# Patient Record
Sex: Male | Born: 1970 | Race: White | Hispanic: No | Marital: Single
Health system: Southern US, Community
[De-identification: ages and names within clinical notes are randomized; demographics above are authoritative.]

---

## 2013-11-09 ENCOUNTER — Emergency Department: Payer: Self-pay | Admitting: Emergency Medicine

## 2013-11-11 ENCOUNTER — Inpatient Hospital Stay: Payer: Self-pay | Admitting: Internal Medicine

## 2013-11-11 LAB — CBC WITH DIFFERENTIAL/PLATELET
Basophil #: 0.1 10*3/uL (ref 0.0–0.1)
Basophil %: 0.7 %
EOS PCT: 1.9 %
Eosinophil #: 0.2 10*3/uL (ref 0.0–0.7)
HCT: 47.8 % (ref 40.0–52.0)
HGB: 16.1 g/dL (ref 13.0–18.0)
LYMPHS ABS: 3 10*3/uL (ref 1.0–3.6)
Lymphocyte %: 34.5 %
MCH: 30.7 pg (ref 26.0–34.0)
MCHC: 33.7 g/dL (ref 32.0–36.0)
MCV: 91 fL (ref 80–100)
MONO ABS: 0.5 x10 3/mm (ref 0.2–1.0)
Monocyte %: 6.2 %
Neutrophil #: 4.9 10*3/uL (ref 1.4–6.5)
Neutrophil %: 56.7 %
PLATELETS: 267 10*3/uL (ref 150–440)
RBC: 5.25 10*6/uL (ref 4.40–5.90)
RDW: 13.6 % (ref 11.5–14.5)
WBC: 8.6 10*3/uL (ref 3.8–10.6)

## 2013-11-11 LAB — COMPREHENSIVE METABOLIC PANEL
ALK PHOS: 133 U/L — AB
Albumin: 3.9 g/dL (ref 3.4–5.0)
Anion Gap: 10 (ref 7–16)
BUN: 17 mg/dL (ref 7–18)
Bilirubin,Total: 0.4 mg/dL (ref 0.2–1.0)
CHLORIDE: 104 mmol/L (ref 98–107)
CREATININE: 0.81 mg/dL (ref 0.60–1.30)
Calcium, Total: 8.9 mg/dL (ref 8.5–10.1)
Co2: 24 mmol/L (ref 21–32)
EGFR (African American): >60
EGFR (Non-African Amer.): >60
GLUCOSE: 106 mg/dL — AB (ref 65–99)
Osmolality: 278 (ref 275–301)
Potassium: 3.9 mmol/L (ref 3.5–5.1)
SGOT(AST): 30 U/L (ref 15–37)
SGPT (ALT): 41 U/L (ref 12–78)
SODIUM: 138 mmol/L (ref 136–145)
TOTAL PROTEIN: 7.3 g/dL (ref 6.4–8.2)

## 2013-11-11 LAB — URINALYSIS, COMPLETE
BLOOD: NEGATIVE
Bacteria: NONE SEEN
Bilirubin,UR: NEGATIVE
GLUCOSE, UR: NEGATIVE mg/dL (ref 0–75)
Ketone: NEGATIVE
Leukocyte Esterase: NEGATIVE
Nitrite: NEGATIVE
PROTEIN: NEGATIVE
Ph: 5 (ref 4.5–8.0)
RBC,UR: NONE SEEN /HPF (ref 0–5)
SPECIFIC GRAVITY: 1.027 (ref 1.003–1.030)
SQUAMOUS EPITHELIAL: NONE SEEN
WBC UR: <1 /HPF (ref 0–5)

## 2013-11-11 LAB — ETHANOL

## 2013-11-11 LAB — LIPASE, BLOOD: LIPASE: 130 U/L (ref 73–393)

## 2013-11-11 LAB — CARBAMAZEPINE LEVEL, TOTAL: Carbamazepine: <0.5 ug/mL — ABNORMAL LOW (ref 4.0–12.0)

## 2013-11-11 LAB — DRUG SCREEN, URINE
Amphetamines, Ur Screen: NEGATIVE (ref ?–1000)
BARBITURATES, UR SCREEN: POSITIVE (ref ?–200)
Benzodiazepine, Ur Scrn: POSITIVE (ref ?–200)
COCAINE METABOLITE, UR ~~LOC~~: NEGATIVE (ref ?–300)
Cannabinoid 50 Ng, Ur ~~LOC~~: POSITIVE (ref ?–50)
MDMA (Ecstasy)Ur Screen: NEGATIVE (ref ?–500)
Methadone, Ur Screen: NEGATIVE (ref ?–300)
Opiate, Ur Screen: NEGATIVE (ref ?–300)
Phencyclidine (PCP) Ur S: NEGATIVE (ref ?–25)
TRICYCLIC, UR SCREEN: NEGATIVE (ref ?–1000)

## 2013-11-11 LAB — PHENOBARBITAL LEVEL: Phenobarbital: <2.1 ug/mL — ABNORMAL LOW (ref 15.0–40.0)

## 2013-11-11 LAB — PROTIME-INR
INR: 1
PROTHROMBIN TIME: 12.9 s (ref 11.5–14.7)

## 2013-11-11 LAB — PHENYTOIN LEVEL, TOTAL: Dilantin: 1 ug/mL — ABNORMAL LOW (ref 10.0–20.0)

## 2013-11-11 LAB — TROPONIN I

## 2013-11-11 LAB — MAGNESIUM: MAGNESIUM: 1.8 mg/dL

## 2013-11-11 LAB — APTT: ACTIVATED PTT: 27.9 s (ref 23.6–35.9)

## 2013-11-12 ENCOUNTER — Ambulatory Visit: Payer: Self-pay | Admitting: Neurology

## 2013-11-12 LAB — RAPID HIV-1/2 QL/CONFIRM: HIV-1/2, RAPID QL: NEGATIVE

## 2013-11-12 LAB — PHENYTOIN LEVEL, TOTAL: Dilantin: 21.7 ug/mL — ABNORMAL HIGH (ref 10.0–20.0)

## 2013-11-13 LAB — CBC WITH DIFFERENTIAL/PLATELET
BASOS PCT: 0.5 %
Basophil #: 0.1 10*3/uL (ref 0.0–0.1)
Eosinophil #: 0.1 10*3/uL (ref 0.0–0.7)
Eosinophil %: 0.5 %
HCT: 41.3 % (ref 40.0–52.0)
HGB: 13.9 g/dL (ref 13.0–18.0)
LYMPHS ABS: 1.4 10*3/uL (ref 1.0–3.6)
Lymphocyte %: 9.5 %
MCH: 30.9 pg (ref 26.0–34.0)
MCHC: 33.7 g/dL (ref 32.0–36.0)
MCV: 92 fL (ref 80–100)
Monocyte #: 0.4 x10 3/mm (ref 0.2–1.0)
Monocyte %: 2.9 %
NEUTROS ABS: 12.8 10*3/uL — AB (ref 1.4–6.5)
Neutrophil %: 86.6 %
PLATELETS: 191 10*3/uL (ref 150–440)
RBC: 4.51 10*6/uL (ref 4.40–5.90)
RDW: 13.4 % (ref 11.5–14.5)
WBC: 14.8 10*3/uL — AB (ref 3.8–10.6)

## 2013-11-13 LAB — MAGNESIUM
MAGNESIUM: 1.5 mg/dL — AB
MAGNESIUM: 1.8 mg/dL

## 2013-11-13 LAB — BASIC METABOLIC PANEL
ANION GAP: 3 — AB (ref 7–16)
BUN: 6 mg/dL — AB (ref 7–18)
CALCIUM: 8 mg/dL — AB (ref 8.5–10.1)
Chloride: 111 mmol/L — ABNORMAL HIGH (ref 98–107)
Co2: 30 mmol/L (ref 21–32)
Creatinine: 1.05 mg/dL (ref 0.60–1.30)
EGFR (African American): >60
EGFR (Non-African Amer.): >60
Glucose: 97 mg/dL (ref 65–99)
OSMOLALITY: 284 (ref 275–301)
POTASSIUM: 3.3 mmol/L — AB (ref 3.5–5.1)
SODIUM: 144 mmol/L (ref 136–145)

## 2013-11-13 LAB — PHOSPHORUS
Phosphorus: 1.1 mg/dL — ABNORMAL LOW (ref 2.5–4.9)
Phosphorus: 4 mg/dL (ref 2.5–4.9)

## 2013-11-13 LAB — PHENYTOIN LEVEL, TOTAL: Dilantin: 13.7 ug/mL (ref 10.0–20.0)

## 2013-11-14 LAB — CBC WITH DIFFERENTIAL/PLATELET
Basophil #: 0 10*3/uL (ref 0.0–0.1)
Basophil %: 0.1 %
Eosinophil #: 0.1 10*3/uL (ref 0.0–0.7)
Eosinophil %: 0.6 %
HCT: 36.6 % — AB (ref 40.0–52.0)
HGB: 12.1 g/dL — AB (ref 13.0–18.0)
LYMPHS PCT: 7.4 %
Lymphocyte #: 1.2 10*3/uL (ref 1.0–3.6)
MCH: 30.2 pg (ref 26.0–34.0)
MCHC: 32.9 g/dL (ref 32.0–36.0)
MCV: 92 fL (ref 80–100)
MONOS PCT: 3.8 %
Monocyte #: 0.6 x10 3/mm (ref 0.2–1.0)
NEUTROS ABS: 14.3 10*3/uL — AB (ref 1.4–6.5)
Neutrophil %: 88.1 %
Platelet: 170 10*3/uL (ref 150–440)
RBC: 3.99 10*6/uL — ABNORMAL LOW (ref 4.40–5.90)
RDW: 13.4 % (ref 11.5–14.5)
WBC: 16.3 10*3/uL — ABNORMAL HIGH (ref 3.8–10.6)

## 2013-11-14 LAB — BASIC METABOLIC PANEL
ANION GAP: 6 — AB (ref 7–16)
BUN: 12 mg/dL (ref 7–18)
CALCIUM: 7.9 mg/dL — AB (ref 8.5–10.1)
CO2: 29 mmol/L (ref 21–32)
Chloride: 106 mmol/L (ref 98–107)
Creatinine: 1.11 mg/dL (ref 0.60–1.30)
EGFR (Non-African Amer.): >60
GLUCOSE: 121 mg/dL — AB (ref 65–99)
OSMOLALITY: 282 (ref 275–301)
Potassium: 3.7 mmol/L (ref 3.5–5.1)
Sodium: 141 mmol/L (ref 136–145)

## 2013-11-15 LAB — COMPREHENSIVE METABOLIC PANEL
ALK PHOS: 96 U/L
ALT: 19 U/L (ref 12–78)
AST: 25 U/L (ref 15–37)
Albumin: 2.2 g/dL — ABNORMAL LOW (ref 3.4–5.0)
Anion Gap: 2 — ABNORMAL LOW (ref 7–16)
BILIRUBIN TOTAL: 0.6 mg/dL (ref 0.2–1.0)
BUN: 12 mg/dL (ref 7–18)
CREATININE: 1.02 mg/dL (ref 0.60–1.30)
Calcium, Total: 8.3 mg/dL — ABNORMAL LOW (ref 8.5–10.1)
Chloride: 108 mmol/L — ABNORMAL HIGH (ref 98–107)
Co2: 31 mmol/L (ref 21–32)
EGFR (Non-African Amer.): >60
Glucose: 114 mg/dL — ABNORMAL HIGH (ref 65–99)
Osmolality: 282 (ref 275–301)
Potassium: 3.6 mmol/L (ref 3.5–5.1)
SODIUM: 141 mmol/L (ref 136–145)
Total Protein: 5.5 g/dL — ABNORMAL LOW (ref 6.4–8.2)

## 2013-11-15 LAB — CBC WITH DIFFERENTIAL/PLATELET
BASOS PCT: 0.3 %
Basophil #: 0 10*3/uL (ref 0.0–0.1)
Eosinophil #: 0.2 10*3/uL (ref 0.0–0.7)
Eosinophil %: 1.6 %
HCT: 34.4 % — ABNORMAL LOW (ref 40.0–52.0)
HGB: 11.3 g/dL — AB (ref 13.0–18.0)
Lymphocyte #: 1.7 10*3/uL (ref 1.0–3.6)
Lymphocyte %: 12.5 %
MCH: 30.4 pg (ref 26.0–34.0)
MCHC: 32.9 g/dL (ref 32.0–36.0)
MCV: 93 fL (ref 80–100)
MONO ABS: 0.4 x10 3/mm (ref 0.2–1.0)
Monocyte %: 3.1 %
Neutrophil #: 11.2 10*3/uL — ABNORMAL HIGH (ref 1.4–6.5)
Neutrophil %: 82.5 %
Platelet: 167 10*3/uL (ref 150–440)
RBC: 3.72 10*6/uL — ABNORMAL LOW (ref 4.40–5.90)
RDW: 13.6 % (ref 11.5–14.5)
WBC: 13.6 10*3/uL — AB (ref 3.8–10.6)

## 2013-11-15 LAB — PHENYTOIN LEVEL, TOTAL: DILANTIN: 15.3 ug/mL (ref 10.0–20.0)

## 2013-11-15 LAB — VANCOMYCIN, TROUGH: Vancomycin, Trough: 19 ug/mL (ref 10–20)

## 2013-11-16 LAB — CBC WITH DIFFERENTIAL/PLATELET
BASOS ABS: 0.1 10*3/uL (ref 0.0–0.1)
BASOS PCT: 0.6 %
EOS PCT: 2.4 %
Eosinophil #: 0.2 10*3/uL (ref 0.0–0.7)
HCT: 35.5 % — AB (ref 40.0–52.0)
HGB: 11.6 g/dL — ABNORMAL LOW (ref 13.0–18.0)
LYMPHS ABS: 1.6 10*3/uL (ref 1.0–3.6)
Lymphocyte %: 20 %
MCH: 30.3 pg (ref 26.0–34.0)
MCHC: 32.8 g/dL (ref 32.0–36.0)
MCV: 93 fL (ref 80–100)
Monocyte #: 0.3 x10 3/mm (ref 0.2–1.0)
Monocyte %: 3.9 %
Neutrophil #: 5.7 10*3/uL (ref 1.4–6.5)
Neutrophil %: 73.1 %
Platelet: 196 10*3/uL (ref 150–440)
RBC: 3.84 10*6/uL — AB (ref 4.40–5.90)
RDW: 13.6 % (ref 11.5–14.5)
WBC: 7.8 10*3/uL (ref 3.8–10.6)

## 2013-11-16 LAB — BASIC METABOLIC PANEL
Anion Gap: 3 — ABNORMAL LOW (ref 7–16)
BUN: 13 mg/dL (ref 7–18)
CALCIUM: 8.6 mg/dL (ref 8.5–10.1)
CHLORIDE: 110 mmol/L — AB (ref 98–107)
CO2: 30 mmol/L (ref 21–32)
Creatinine: 0.74 mg/dL (ref 0.60–1.30)
EGFR (African American): >60
Glucose: 119 mg/dL — ABNORMAL HIGH (ref 65–99)
Osmolality: 286 (ref 275–301)
POTASSIUM: 3.3 mmol/L — AB (ref 3.5–5.1)
SODIUM: 143 mmol/L (ref 136–145)

## 2013-11-16 LAB — EXPECTORATED SPUTUM ASSESSMENT W GRAM STAIN, RFLX TO RESP C

## 2013-11-16 LAB — VANCOMYCIN, TROUGH: VANCOMYCIN, TROUGH: 17 ug/mL (ref 10–20)

## 2013-11-16 LAB — MAGNESIUM: MAGNESIUM: 2.1 mg/dL

## 2013-11-17 LAB — BASIC METABOLIC PANEL
ANION GAP: 4 — AB (ref 7–16)
BUN: 13 mg/dL (ref 7–18)
CALCIUM: 8.5 mg/dL (ref 8.5–10.1)
CO2: 27 mmol/L (ref 21–32)
Chloride: 112 mmol/L — ABNORMAL HIGH (ref 98–107)
Creatinine: 0.69 mg/dL (ref 0.60–1.30)
EGFR (African American): >60
GLUCOSE: 94 mg/dL (ref 65–99)
Osmolality: 285 (ref 275–301)
Potassium: 3.5 mmol/L (ref 3.5–5.1)
Sodium: 143 mmol/L (ref 136–145)

## 2013-11-18 LAB — CBC WITH DIFFERENTIAL/PLATELET
Basophil: 1 %
Eosinophil: 1 %
HCT: 38.7 % — AB (ref 40.0–52.0)
HGB: 13.4 g/dL (ref 13.0–18.0)
Lymphocytes: 35 %
MCH: 31.3 pg (ref 26.0–34.0)
MCHC: 34.6 g/dL (ref 32.0–36.0)
MCV: 90 fL (ref 80–100)
METAMYELOCYTE: 2 %
Monocytes: 6 %
Myelocyte: 1 %
Platelet: 233 10*3/uL (ref 150–440)
RBC: 4.28 10*6/uL — ABNORMAL LOW (ref 4.40–5.90)
RDW: 13.2 % (ref 11.5–14.5)
Segmented Neutrophils: 54 %
WBC: 7.1 10*3/uL (ref 3.8–10.6)

## 2013-11-18 LAB — BASIC METABOLIC PANEL
Anion Gap: 4 — ABNORMAL LOW (ref 7–16)
BUN: 14 mg/dL (ref 7–18)
CALCIUM: 8.6 mg/dL (ref 8.5–10.1)
CREATININE: 0.73 mg/dL (ref 0.60–1.30)
Chloride: 111 mmol/L — ABNORMAL HIGH (ref 98–107)
Co2: 26 mmol/L (ref 21–32)
GLUCOSE: 105 mg/dL — AB (ref 65–99)
OSMOLALITY: 282 (ref 275–301)
Potassium: 3.4 mmol/L — ABNORMAL LOW (ref 3.5–5.1)
SODIUM: 141 mmol/L (ref 136–145)

## 2013-11-18 LAB — PHOSPHORUS: Phosphorus: 4.3 mg/dL (ref 2.5–4.9)

## 2013-11-18 LAB — MAGNESIUM: Magnesium: 1.6 mg/dL — ABNORMAL LOW

## 2013-11-18 LAB — CULTURE, BLOOD (SINGLE)

## 2013-11-18 LAB — EXPECTORATED SPUTUM ASSESSMENT W REFEX TO RESP CULTURE

## 2013-11-20 LAB — CBC WITH DIFFERENTIAL/PLATELET
BASOS ABS: 0 10*3/uL (ref 0.0–0.1)
BASOS PCT: 0.5 %
EOS ABS: 0.3 10*3/uL (ref 0.0–0.7)
Eosinophil %: 3 %
HCT: 37.5 % — ABNORMAL LOW (ref 40.0–52.0)
HGB: 12.8 g/dL — AB (ref 13.0–18.0)
LYMPHS PCT: 26.2 %
Lymphocyte #: 2.2 10*3/uL (ref 1.0–3.6)
MCH: 30.5 pg (ref 26.0–34.0)
MCHC: 34.2 g/dL (ref 32.0–36.0)
MCV: 89 fL (ref 80–100)
MONOS PCT: 9.6 %
Monocyte #: 0.8 x10 3/mm (ref 0.2–1.0)
Neutrophil #: 5.2 10*3/uL (ref 1.4–6.5)
Neutrophil %: 60.7 %
Platelet: 247 10*3/uL (ref 150–440)
RBC: 4.2 10*6/uL — ABNORMAL LOW (ref 4.40–5.90)
RDW: 13.2 % (ref 11.5–14.5)
WBC: 8.5 10*3/uL (ref 3.8–10.6)

## 2013-11-20 LAB — BASIC METABOLIC PANEL
Anion Gap: 11 (ref 7–16)
BUN: 14 mg/dL (ref 7–18)
Calcium, Total: 8.9 mg/dL (ref 8.5–10.1)
Chloride: 106 mmol/L (ref 98–107)
Co2: 25 mmol/L (ref 21–32)
Creatinine: 0.63 mg/dL (ref 0.60–1.30)
EGFR (African American): >60
EGFR (Non-African Amer.): >60
GLUCOSE: 101 mg/dL — AB (ref 65–99)
Osmolality: 284 (ref 275–301)
Potassium: 3.4 mmol/L — ABNORMAL LOW (ref 3.5–5.1)
Sodium: 142 mmol/L (ref 136–145)

## 2013-12-04 ENCOUNTER — Encounter: Payer: Self-pay | Admitting: Internal Medicine

## 2013-12-11 ENCOUNTER — Emergency Department: Payer: Self-pay | Admitting: Emergency Medicine

## 2013-12-11 LAB — COMPREHENSIVE METABOLIC PANEL
ALT: 49 U/L (ref 12–78)
ANION GAP: 7 (ref 7–16)
AST: 38 U/L — AB (ref 15–37)
Albumin: 4 g/dL (ref 3.4–5.0)
Alkaline Phosphatase: 159 U/L — ABNORMAL HIGH
BILIRUBIN TOTAL: 0.3 mg/dL (ref 0.2–1.0)
BUN: 14 mg/dL (ref 7–18)
Calcium, Total: 9.2 mg/dL (ref 8.5–10.1)
Chloride: 105 mmol/L (ref 98–107)
Co2: 26 mmol/L (ref 21–32)
Creatinine: 0.81 mg/dL (ref 0.60–1.30)
Glucose: 93 mg/dL (ref 65–99)
OSMOLALITY: 276 (ref 275–301)
POTASSIUM: 4.1 mmol/L (ref 3.5–5.1)
Sodium: 138 mmol/L (ref 136–145)
TOTAL PROTEIN: 7.8 g/dL (ref 6.4–8.2)

## 2013-12-11 LAB — CBC
HCT: 47.1 % (ref 40.0–52.0)
HGB: 15.9 g/dL (ref 13.0–18.0)
MCH: 30.5 pg (ref 26.0–34.0)
MCHC: 33.9 g/dL (ref 32.0–36.0)
MCV: 90 fL (ref 80–100)
PLATELETS: 361 10*3/uL (ref 150–440)
RBC: 5.22 10*6/uL (ref 4.40–5.90)
RDW: 14 % (ref 11.5–14.5)
WBC: 5.5 10*3/uL (ref 3.8–10.6)

## 2013-12-11 LAB — PHENYTOIN LEVEL, TOTAL: DILANTIN: 0.7 ug/mL — AB (ref 10.0–20.0)

## 2013-12-19 ENCOUNTER — Encounter: Payer: Self-pay | Admitting: Internal Medicine

## 2014-01-26 ENCOUNTER — Inpatient Hospital Stay: Payer: Self-pay | Admitting: Internal Medicine

## 2014-01-26 LAB — DRUG SCREEN, URINE
AMPHETAMINES, UR SCREEN: NEGATIVE (ref ?–1000)
BARBITURATES, UR SCREEN: NEGATIVE (ref ?–200)
Benzodiazepine, Ur Scrn: POSITIVE (ref ?–200)
COCAINE METABOLITE, UR ~~LOC~~: NEGATIVE (ref ?–300)
Cannabinoid 50 Ng, Ur ~~LOC~~: POSITIVE (ref ?–50)
MDMA (Ecstasy)Ur Screen: NEGATIVE (ref ?–500)
Methadone, Ur Screen: NEGATIVE (ref ?–300)
OPIATE, UR SCREEN: NEGATIVE (ref ?–300)
Phencyclidine (PCP) Ur S: NEGATIVE (ref ?–25)
Tricyclic, Ur Screen: NEGATIVE (ref ?–1000)

## 2014-01-26 LAB — URINALYSIS, COMPLETE
BACTERIA: NONE SEEN
Bilirubin,UR: NEGATIVE
Blood: NEGATIVE
GLUCOSE, UR: NEGATIVE mg/dL (ref 0–75)
Ketone: NEGATIVE
Leukocyte Esterase: NEGATIVE
NITRITE: NEGATIVE
Ph: 6 (ref 4.5–8.0)
Protein: NEGATIVE
RBC,UR: NONE SEEN /HPF (ref 0–5)
SPECIFIC GRAVITY: 1.003 (ref 1.003–1.030)
Squamous Epithelial: NONE SEEN

## 2014-01-26 LAB — CBC
HCT: 48.1 % (ref 40.0–52.0)
HGB: 16.6 g/dL (ref 13.0–18.0)
MCH: 31.2 pg (ref 26.0–34.0)
MCHC: 34.5 g/dL (ref 32.0–36.0)
MCV: 90 fL (ref 80–100)
PLATELETS: 345 10*3/uL (ref 150–440)
RBC: 5.32 10*6/uL (ref 4.40–5.90)
RDW: 14.5 % (ref 11.5–14.5)
WBC: 12.1 10*3/uL — ABNORMAL HIGH (ref 3.8–10.6)

## 2014-01-26 LAB — ETHANOL
Ethanol %: 0.121 % — ABNORMAL HIGH (ref 0.000–0.080)
Ethanol: 121 mg/dL

## 2014-01-26 LAB — COMPREHENSIVE METABOLIC PANEL
ALK PHOS: 152 U/L — AB
ANION GAP: 12 (ref 7–16)
Albumin: 4.6 g/dL (ref 3.4–5.0)
BILIRUBIN TOTAL: 0.3 mg/dL (ref 0.2–1.0)
BUN: 12 mg/dL (ref 7–18)
CREATININE: 1.01 mg/dL (ref 0.60–1.30)
Calcium, Total: 9.7 mg/dL (ref 8.5–10.1)
Chloride: 105 mmol/L (ref 98–107)
Co2: 23 mmol/L (ref 21–32)
EGFR (Non-African Amer.): >60
Glucose: 108 mg/dL — ABNORMAL HIGH (ref 65–99)
Osmolality: 280 (ref 275–301)
Potassium: 3.6 mmol/L (ref 3.5–5.1)
SGOT(AST): 33 U/L (ref 15–37)
SGPT (ALT): 51 U/L
SODIUM: 140 mmol/L (ref 136–145)
TOTAL PROTEIN: 8.3 g/dL — AB (ref 6.4–8.2)

## 2014-01-26 LAB — ACETAMINOPHEN LEVEL

## 2014-01-26 LAB — SALICYLATE LEVEL: Salicylates, Serum: 3.4 mg/dL — ABNORMAL HIGH

## 2014-01-26 LAB — TSH: Thyroid Stimulating Horm: 4.36 u[IU]/mL

## 2014-01-27 LAB — BASIC METABOLIC PANEL
ANION GAP: 9 (ref 7–16)
BUN: 12 mg/dL (ref 7–18)
CALCIUM: 8.8 mg/dL (ref 8.5–10.1)
CREATININE: 0.84 mg/dL (ref 0.60–1.30)
Chloride: 106 mmol/L (ref 98–107)
Co2: 27 mmol/L (ref 21–32)
EGFR (Non-African Amer.): >60
Glucose: 106 mg/dL — ABNORMAL HIGH (ref 65–99)
OSMOLALITY: 283 (ref 275–301)
Potassium: 3.7 mmol/L (ref 3.5–5.1)
Sodium: 142 mmol/L (ref 136–145)

## 2014-01-27 LAB — CBC WITH DIFFERENTIAL/PLATELET
BASOS PCT: 0.6 %
Basophil #: 0.1 10*3/uL (ref 0.0–0.1)
EOS ABS: 0.1 10*3/uL (ref 0.0–0.7)
Eosinophil %: 1.2 %
HCT: 44.6 % (ref 40.0–52.0)
HGB: 15.3 g/dL (ref 13.0–18.0)
LYMPHS PCT: 31.1 %
Lymphocyte #: 2.6 10*3/uL (ref 1.0–3.6)
MCH: 30.9 pg (ref 26.0–34.0)
MCHC: 34.2 g/dL (ref 32.0–36.0)
MCV: 91 fL (ref 80–100)
MONOS PCT: 8 %
Monocyte #: 0.7 x10 3/mm (ref 0.2–1.0)
Neutrophil #: 5 10*3/uL (ref 1.4–6.5)
Neutrophil %: 59.1 %
Platelet: 286 10*3/uL (ref 150–440)
RBC: 4.93 10*6/uL (ref 4.40–5.90)
RDW: 14.4 % (ref 11.5–14.5)
WBC: 8.4 10*3/uL (ref 3.8–10.6)

## 2014-01-27 LAB — MAGNESIUM: Magnesium: 1.9 mg/dL

## 2014-01-28 LAB — MAGNESIUM: Magnesium: 1.8 mg/dL

## 2014-01-28 LAB — COMPREHENSIVE METABOLIC PANEL
ALBUMIN: 3.2 g/dL — AB (ref 3.4–5.0)
ALK PHOS: 128 U/L — AB
ANION GAP: 9 (ref 7–16)
BILIRUBIN TOTAL: 0.4 mg/dL (ref 0.2–1.0)
BUN: 9 mg/dL (ref 7–18)
CREATININE: 0.87 mg/dL (ref 0.60–1.30)
Calcium, Total: 7.9 mg/dL — ABNORMAL LOW (ref 8.5–10.1)
Chloride: 113 mmol/L — ABNORMAL HIGH (ref 98–107)
Co2: 24 mmol/L (ref 21–32)
EGFR (Non-African Amer.): >60
GLUCOSE: 86 mg/dL (ref 65–99)
OSMOLALITY: 289 (ref 275–301)
Potassium: 4.5 mmol/L (ref 3.5–5.1)
SGOT(AST): 49 U/L — ABNORMAL HIGH (ref 15–37)
SGPT (ALT): 48 U/L
Sodium: 146 mmol/L — ABNORMAL HIGH (ref 136–145)
TOTAL PROTEIN: 5.9 g/dL — AB (ref 6.4–8.2)

## 2014-01-28 LAB — PHOSPHORUS: Phosphorus: 2.7 mg/dL (ref 2.5–4.9)

## 2014-01-29 LAB — BASIC METABOLIC PANEL
Anion Gap: 4 — ABNORMAL LOW (ref 7–16)
BUN: 6 mg/dL — ABNORMAL LOW (ref 7–18)
CALCIUM: 8.1 mg/dL — AB (ref 8.5–10.1)
CO2: 27 mmol/L (ref 21–32)
Chloride: 113 mmol/L — ABNORMAL HIGH (ref 98–107)
Creatinine: 0.82 mg/dL (ref 0.60–1.30)
EGFR (African American): >60
Glucose: 93 mg/dL (ref 65–99)
Osmolality: 284 (ref 275–301)
Potassium: 3.6 mmol/L (ref 3.5–5.1)
Sodium: 144 mmol/L (ref 136–145)

## 2014-01-29 LAB — TRIGLYCERIDES: TRIGLYCERIDES: 170 mg/dL (ref 0–200)

## 2014-01-29 LAB — MAGNESIUM: Magnesium: 1.2 mg/dL — ABNORMAL LOW

## 2014-01-29 LAB — PHOSPHORUS: Phosphorus: 3.3 mg/dL (ref 2.5–4.9)

## 2014-01-30 LAB — MAGNESIUM
MAGNESIUM: 1.8 mg/dL
Magnesium: 1.8 mg/dL

## 2014-01-30 LAB — BASIC METABOLIC PANEL
Anion Gap: 5 — ABNORMAL LOW (ref 7–16)
BUN: 8 mg/dL (ref 7–18)
CO2: 30 mmol/L (ref 21–32)
Calcium, Total: 8 mg/dL — ABNORMAL LOW (ref 8.5–10.1)
Chloride: 113 mmol/L — ABNORMAL HIGH (ref 98–107)
Creatinine: 0.86 mg/dL (ref 0.60–1.30)
EGFR (Non-African Amer.): >60
Glucose: 105 mg/dL — ABNORMAL HIGH (ref 65–99)
Osmolality: 293 (ref 275–301)
POTASSIUM: 3.7 mmol/L (ref 3.5–5.1)
SODIUM: 148 mmol/L — AB (ref 136–145)

## 2014-01-30 LAB — PHOSPHORUS: Phosphorus: 3.7 mg/dL (ref 2.5–4.9)

## 2014-01-31 LAB — BASIC METABOLIC PANEL
ANION GAP: 8 (ref 7–16)
BUN: 11 mg/dL (ref 7–18)
CALCIUM: 8.2 mg/dL — AB (ref 8.5–10.1)
Chloride: 109 mmol/L — ABNORMAL HIGH (ref 98–107)
Co2: 26 mmol/L (ref 21–32)
Creatinine: 0.58 mg/dL — ABNORMAL LOW (ref 0.60–1.30)
Glucose: 107 mg/dL — ABNORMAL HIGH (ref 65–99)
OSMOLALITY: 285 (ref 275–301)
Potassium: 3.4 mmol/L — ABNORMAL LOW (ref 3.5–5.1)
Sodium: 143 mmol/L (ref 136–145)

## 2014-01-31 LAB — PHOSPHORUS: Phosphorus: 4 mg/dL (ref 2.5–4.9)

## 2014-01-31 LAB — MAGNESIUM: Magnesium: 1.7 mg/dL — ABNORMAL LOW

## 2014-02-01 LAB — BASIC METABOLIC PANEL
ANION GAP: 8 (ref 7–16)
BUN: 16 mg/dL (ref 7–18)
CHLORIDE: 106 mmol/L (ref 98–107)
CO2: 29 mmol/L (ref 21–32)
CREATININE: 0.73 mg/dL (ref 0.60–1.30)
Calcium, Total: 8.4 mg/dL — ABNORMAL LOW (ref 8.5–10.1)
EGFR (African American): >60
EGFR (Non-African Amer.): >60
Glucose: 94 mg/dL (ref 65–99)
OSMOLALITY: 286 (ref 275–301)
POTASSIUM: 4 mmol/L (ref 3.5–5.1)
SODIUM: 143 mmol/L (ref 136–145)

## 2014-02-01 LAB — MAGNESIUM: Magnesium: 2 mg/dL

## 2014-02-02 LAB — BASIC METABOLIC PANEL
ANION GAP: 7 (ref 7–16)
BUN: 19 mg/dL — AB (ref 7–18)
CALCIUM: 8.9 mg/dL (ref 8.5–10.1)
CHLORIDE: 103 mmol/L (ref 98–107)
Co2: 32 mmol/L (ref 21–32)
Creatinine: 0.8 mg/dL (ref 0.60–1.30)
Glucose: 90 mg/dL (ref 65–99)
OSMOLALITY: 285 (ref 275–301)
Potassium: 4.1 mmol/L (ref 3.5–5.1)
SODIUM: 142 mmol/L (ref 136–145)

## 2014-02-02 LAB — MAGNESIUM: MAGNESIUM: 2 mg/dL

## 2014-02-02 LAB — PHOSPHORUS: Phosphorus: 3.8 mg/dL (ref 2.5–4.9)

## 2014-02-03 LAB — BASIC METABOLIC PANEL
ANION GAP: 8 (ref 7–16)
BUN: 22 mg/dL — ABNORMAL HIGH (ref 7–18)
CHLORIDE: 102 mmol/L (ref 98–107)
CREATININE: 0.64 mg/dL (ref 0.60–1.30)
Calcium, Total: 8.6 mg/dL (ref 8.5–10.1)
Co2: 31 mmol/L (ref 21–32)
EGFR (African American): >60
Glucose: 112 mg/dL — ABNORMAL HIGH (ref 65–99)
Osmolality: 285 (ref 275–301)
POTASSIUM: 4.1 mmol/L (ref 3.5–5.1)
Sodium: 141 mmol/L (ref 136–145)

## 2014-02-03 LAB — MAGNESIUM: Magnesium: 1.6 mg/dL — ABNORMAL LOW

## 2014-02-03 LAB — PHOSPHORUS: PHOSPHORUS: 2.9 mg/dL (ref 2.5–4.9)

## 2014-02-04 LAB — CBC WITH DIFFERENTIAL/PLATELET
Basophil #: 0.1 10*3/uL (ref 0.0–0.1)
Basophil %: 1.1 %
Eosinophil #: 0.1 10*3/uL (ref 0.0–0.7)
Eosinophil %: 1 %
HCT: 39.9 % — ABNORMAL LOW (ref 40.0–52.0)
HGB: 13.7 g/dL (ref 13.0–18.0)
Lymphocyte #: 1.5 10*3/uL (ref 1.0–3.6)
Lymphocyte %: 18.7 %
MCH: 30.3 pg (ref 26.0–34.0)
MCHC: 34.3 g/dL (ref 32.0–36.0)
MCV: 88 fL (ref 80–100)
Monocyte #: 0.7 x10 3/mm (ref 0.2–1.0)
Monocyte %: 8.1 %
Neutrophil #: 5.7 10*3/uL (ref 1.4–6.5)
Neutrophil %: 71.1 %
PLATELETS: 310 10*3/uL (ref 150–440)
RBC: 4.52 10*6/uL (ref 4.40–5.90)
RDW: 13.4 % (ref 11.5–14.5)
WBC: 8 10*3/uL (ref 3.8–10.6)

## 2014-02-04 LAB — BASIC METABOLIC PANEL
Anion Gap: 9 (ref 7–16)
BUN: 19 mg/dL — ABNORMAL HIGH (ref 7–18)
CALCIUM: 8.4 mg/dL — AB (ref 8.5–10.1)
CHLORIDE: 105 mmol/L (ref 98–107)
CO2: 29 mmol/L (ref 21–32)
Creatinine: 0.59 mg/dL — ABNORMAL LOW (ref 0.60–1.30)
EGFR (African American): >60
EGFR (Non-African Amer.): >60
Glucose: 113 mg/dL — ABNORMAL HIGH (ref 65–99)
Osmolality: 288 (ref 275–301)
POTASSIUM: 3.8 mmol/L (ref 3.5–5.1)
Sodium: 143 mmol/L (ref 136–145)

## 2014-02-04 LAB — MAGNESIUM: Magnesium: 1.9 mg/dL

## 2014-02-04 LAB — PHOSPHORUS
Phosphorus: 2.4 mg/dL — ABNORMAL LOW (ref 2.5–4.9)
Phosphorus: 1.8 mg/dL — ABNORMAL LOW (ref 2.5–4.9)

## 2014-02-05 ENCOUNTER — Inpatient Hospital Stay: Payer: Self-pay | Admitting: Psychiatry

## 2014-02-05 LAB — BASIC METABOLIC PANEL
Anion Gap: 10 (ref 7–16)
BUN: 18 mg/dL (ref 7–18)
CALCIUM: 8.5 mg/dL (ref 8.5–10.1)
CO2: 26 mmol/L (ref 21–32)
CREATININE: 0.63 mg/dL (ref 0.60–1.30)
Chloride: 106 mmol/L (ref 98–107)
EGFR (African American): >60
EGFR (Non-African Amer.): >60
GLUCOSE: 103 mg/dL — AB (ref 65–99)
Osmolality: 285 (ref 275–301)
POTASSIUM: 3.5 mmol/L (ref 3.5–5.1)
SODIUM: 142 mmol/L (ref 136–145)

## 2014-02-05 LAB — CBC WITH DIFFERENTIAL/PLATELET
BASOS ABS: 0.2 10*3/uL — AB (ref 0.0–0.1)
Basophil %: 2.8 %
EOS ABS: 0.1 10*3/uL (ref 0.0–0.7)
Eosinophil %: 1.5 %
HCT: 39 % — AB (ref 40.0–52.0)
HGB: 13.3 g/dL (ref 13.0–18.0)
Lymphocyte #: 1.5 10*3/uL (ref 1.0–3.6)
Lymphocyte %: 17.3 %
MCH: 30.3 pg (ref 26.0–34.0)
MCHC: 34 g/dL (ref 32.0–36.0)
MCV: 89 fL (ref 80–100)
MONOS PCT: 8.9 %
Monocyte #: 0.8 x10 3/mm (ref 0.2–1.0)
NEUTROS ABS: 6 10*3/uL (ref 1.4–6.5)
Neutrophil %: 69.5 %
PLATELETS: 326 10*3/uL (ref 150–440)
RBC: 4.37 10*6/uL — ABNORMAL LOW (ref 4.40–5.90)
RDW: 13.3 % (ref 11.5–14.5)
WBC: 8.6 10*3/uL (ref 3.8–10.6)

## 2014-02-05 LAB — MAGNESIUM: Magnesium: 1.7 mg/dL — ABNORMAL LOW

## 2014-02-05 LAB — PHOSPHORUS: PHOSPHORUS: 3.1 mg/dL (ref 2.5–4.9)

## 2014-02-08 ENCOUNTER — Ambulatory Visit: Payer: Self-pay | Admitting: Neurology

## 2014-10-12 NOTE — Consult Note (Signed)
PATIENT NAME:  [name], [name] MR#:  [id number] DATE OF BIRTH:  [birth year]  DATE OF CONSULTATION:  11/18/2013  REFERRING PHYSICIAN:   CONSULTING PHYSICIAN:  Dalyn Becker K. Emmanual Gauthreaux, MD  AGE: 44 years.  SEX:  Male.  RACE:  White.  SUBJECTIVE:  [facility].  Patient is a 44 year old white male who is a poor historian.  Patient was seen with his cousin who is 761 years old and he is a good historian.  She reported that patient is not employed and has been living with a girlfriend, but however, the staff reports that he cannot go back to the girlfriend.  Patient was [facility] Room because he had a seizure.  He has a history of epilepsy and had been noncompliant with the medications.    PAST PSYCHIATRIC HISTORY:  Patient and cousin deny any previous history of inpatient  psychiatry.  No history of suicide attempt. Not been followed by a psychiatrist, but the staff reports that the girlfriend stated something different.    ALCOHOL AND DRUGS:  Patient denies drinking alcohol.  Does admit to abusing opiates, and according to the staff, patient has been injecting heroin into the system. Patient was not a good historian and does not know when he used the opiates the last time.  Does admit smoking nicotine cigarettes.   MENTAL STATUS:  Patient was seen lying in bed in CCU.  Drowsy but is arousable.  He knew that he was in the hospital.  He had to be prompted about date.  He denies feeling depressed.  He answered no, though cousin said he is depressed.  Denies feeling hopeless or helpless.  Denies feeling worthless or useless.  According to the cousin, he had been hallucinating, had been seeing things, and talking to things that are not there.  But he could identify his cousin.  He knew her age and her name.  Denies any active suicidal or homicidal plans.  Appears to have manipulative behavior as he told the staff that he was going to sign out AMA tomorrow morning  when he is going to be ready for discharge when he was told about the same.  Insight and judgment guarded.  Impulse control is poor.   IMPRESSION:  Substance abuse - opiates, chronic continues according to history obtained.  Nicotine dependence, substance induced mood disorder, and behavior disturbances.  I recommend continue current treatment and re-consult tomorrow; that is, 11/19/2013, to see if patient is interested in going for any substance abuse treatment program or appropriate disposition with the help of social services and psychiatry consulted.    Thanks for referring the patient to us.     ____________________________ Jannet MantisSurya K. Guss Bundehalla, MD skc:dd D: 11/18/2013 18:18:11 ET T: 11/18/2013 18:52:23 ET JOB#: [id number]  cc: Monika SalkSurya K. Guss Bundehalla, MD, <Dictator> Beau FannySURYA K Adael Culbreath MD ELECTRONICALLY SIGNED 11/20/2013 7:46

## 2014-10-12 NOTE — Consult Note (Signed)
Brief Consult Note: Diagnosis: Delirium related to Substance use and w/d.   Patient was seen by consultant.   Comments: Pt seen in ICU. He was extubated this am and exhibiting agitation and c/o pain all over. His voice is hoarse and difficult to understand. He is being monitored by the sitter. Sitter reported that he is becoming agitated at times. He is getting prn meds.   Plan IVC renewed at this. [facility] Continue prn meds.  Psychiatry to follow along.  Electronic Signatures: Rhunette CroftFaheem, Alastor Kneale S (MD)  (Signed 17-Aug-15 15:14)  Authored: Brief Consult Note   Last Updated: 17-Aug-15 15:14 by Rhunette CroftFaheem, Joanny Dupree S (MD)

## 2014-10-12 NOTE — Consult Note (Signed)
PATIENT NAME:  [name], [name] MR#:  [id number] DATE OF BIRTH:  1971-03-01  DATE OF CONSULTATION:  11/12/2013  REFERRING PHYSICIAN:   CONSULTING PHYSICIAN:  Justin BrownsYuriy Jeyli Zwicker, MD  REASON FOR CONSULTATION: Seizures.  HISTORY OF PRESENT ILLNESS: This is a 44 year old Caucasian gentleman with past medical history of seizure disorder, on and off antiepileptic medications; questionable use of phenobarbital, Tegretol and Dilantin, presented to hospital a few days ago with generalized tonic-clonic seizure x 3 about 2 days ago. At that point, did not receive treatment as he left AMA. Presenting with further episodes of generalized tonic-clonic seizure activity also witnessed by EMS, status post 4 mg of Versed after which the patient was loaded with Dilantin. Since his mental status was not improving, the patient was intubated. Total dose of Dilantin received was 2400 mg. Currently on propofol, fentanyl and Levophed for goal amounts over 65. Apparently the patient was not compliant with his medications.   REVIEW OF SYSTEMS: Unable to obtain.  PAST MEDICAL HISTORY: History of seizure disorder.   SOCIAL HISTORY: Tobacco use. Denies any alcohol or drug use.   FAMILY HISTORY: Unable to obtain.   ALLERGIES: ASPIRIN, COCONUT AND BEE STINGS.   HOME MEDICATIONS: None as patient was not taking any, but apparently was on phenobarbital and Tegretol at one point.  Laboratory workup reviewed. The patient is status post CT of the head. It did not show any acute intracranial abnormalities.   PHYSICAL EXAMINATION:   VITAL SIGNS: Include a pulse of 64, respirations 14, blood pressure 112/66, pulse oximetry is 100.  NEUROLOGIC: The patient is sedated does not follow commands, does not grimace to any painful stimuli. Does not move any of his extremities to any painful stimuli. Sensation and coordination and gait could not be assessed. Reflexes diminished. The patient does have disconjugate gaze, which does not respond to  visual threats.   IMPRESSION: This is a 44 year old gentleman with history of seizure disorder, multiple seizures in the past, noncompliant with medications, has been on phenobarbital, Dilantin and Tegretol. Levels are undetectable, so the patient was not taking any of the antiepileptics. He is presenting with multiple seizure episodes, status post intubation and was loaded on Dilantin. Dilantin load was 2400 mg, which based on his current weight is too high, it should have been around 1600 to 1700 mg.   PLAN: At this point, I would not order any more Dilantin. Would check a stat total Dilantin level as supratherapeutic Dilantin level can be pro-epileptogenic. EEG in the morning. Would not  decrease any of his sedation as we want to make sure the patient is not in non-convulsive status or not having any further seizure activity before titrating off propofol or fentanyl. This was discussed with primary team and CCU nursing staff. Thank you, it was a pleasure seeing this patient.  ____________________________ Justin BrownsYuriy Holbert Caples, MD yz:aw D: 11/12/2013 12:44:55 ET T: 11/12/2013 12:58:16 ET JOB#: [id number]  cc: Justin BrownsYuriy Zoejane Gaulin, MD, <Dictator> Justin BrownsYURIY Scotti Kosta MD ELECTRONICALLY SIGNED 11/19/2013 18:45

## 2014-10-12 NOTE — H&P (Signed)
PATIENT NAME:  [name], [name] MR#:  [id number] DATE OF BIRTH:  [birth year]  DATE OF ADMISSION:  11/11/2013  REFERRING PHYSICIAN: Dr. York CeriseForbach.  PRIMARY CARE PHYSICIAN: None. [place] from [place] and has not yet established PCP, has no neurologist as well.   CHIEF COMPLAINT: Seizures.  HISTORY OF PRESENT ILLNESS: A 44 year old Caucasian gentleman with past medical history of seizures presenting with seizures. The patient is unable to provide any information, given mental status and medical condition as he is intubated. Per his girlfriend at the bedside, states that he has had 2-3 seizures about 3 days ago. Came to the Emergency Department; however, left AMA prior to any evaluation and went back home, had 2 more seizures today on the day of arrival. One of them involved loss of consciousness and head trauma resulting in a 1-2- cm laceration above his right eye with postictal confusion. States that she has witnessed these seizures and general tonic-clonic activity, and they called EMS. He apparently had 2 more episodes of seizures with EMS, received a total of 4 mg of Versed by EMS. Upon arrival to the ED, noted to be shallow breathing with marked confusion and agitation. He was then subsequently intubated for airway protection.  REVIEW OF SYSTEMS: Unable to provide given patient's current mental status and medical condition.   PAST MEDICAL HISTORY: Per girlfriend for seizure disorder, no other medical history.   SOCIAL HISTORY: Positive for tobacco use. Denies any alcohol or drug use.   FAMILY HISTORY: Unable to fully obtain given patient's mental status and medical condition.   ALLERGIES: ASPIRIN, COCONUT, AND BEE STINGS.   HOME MEDICATIONS: None. Apparently he was previously on phenobarbital, Dilantin, and Tegretol. Though according to girlfriend has not been on these for many months.    PHYSICAL EXAMINATION:  VITAL SIGNS: Heart rate 94, respirations 16, blood pressure 141/73,  saturating 96% on assist control. Weight 80 kg, BMI 23.9.  GENERAL: Critically ill-appearing Caucasian gentleman as he is intubated and sedated.  HEAD: Normocephalic. There is a 1-2-cm laceration above the right eyebrow. No other contusions or lacerations.  EYES: Pupils equal, round, sluggishly reactive. Unable to assess extraocular muscle activity as he is sedated at this time. Positive conjunctival reflux. No scleral icterus.  MOUTH: Moist mucous membranes. Dentition intact. No abscess noted.  EARS, NOSE, AND THROAT: Clear without exudates. No external lesions.  NECK: Supple. No thyromegaly. No nodules. No JVD.  PULMONARY: Clear to auscultation bilaterally without wheezes, rales, or rhonchi. No use of accessory muscles. Good respiratory effort.  CHEST:  Nontender on palpation.  CARDIOVASCULAR: S1, S2, regular rate and rhythm. No murmurs, rubs, or gallops. No edema. Pedal pulses 2+ bilaterally.  GASTROINTESTINAL: Soft, nontender, nondistended. No masses. Positive bowel sounds. No hepatosplenomegaly.  MUSCULOSKELETAL: No swelling, clubbing, or edema. Range of motion passively full in all extremities. NEUROLOGIC: Unable to fully assess given patient's mental status and medical condition. Corneal reflex intact. Gag reflex intact. He was spontaneously moving all extremities prior to intubation. The Babinski sign within normal limits.  SKIN: No ulcerations, lesions, no rashes, or cyanosis. Multiple tattoos. Skin warm and dry and turgor intact.  PSYCHIATRIC: Mood and affect unable to fully assess given patient's mental status and medical condition.   LABORATORY DATA: Sodium 138, potassium 3.9, chloride 104, bicarbonate 24, BUN 17, creatinine 0.81, glucose 106, alkaline phosphatase 133. Remainder of LFTs within normal limits. Dilantin level 1. Phenobarbital level less than 2.1. Tegretol less than 0.5. Urine drug screen positive for barbiturates, benzodiazepine, as  well as cannabinoids. WBC 8.6, hemoglobin  16.1, platelets of 267,000. Urinalysis negative for evidence of infection. ABG performed on assist control 14, 500, PEEP of 5, pH 7.36, CO2 of 48, o2 of 70, bicarbonate 27.1 FiO2 of 40%.   Head CT performed revealing no acute intracranial process. Chest x-ray performed. Endotracheal tube 5 cm above carina. No acute cardiopulmonary process. CT C-spine performed. No evidence of spinal fracture or  trauma.   ASSESSMENT AND PLAN: A 44 year old gentleman with a history of seizure disorder presenting after multiple seizure activity.  1. Recurrent seizures. Previously on phenobarbital, Dilantin, and Tegretol per documentation. However, per girlfriend at bedside states he has not used these medications in greater than 5 months. He is currently intubated and sedated. Will continue sedation with propofol which will also decrease any seizure activity. He has been loaded with Dilantin. The Emergency Department will consult neurology and proceed from their recommendations.  2. Acute respiratory failure and continue full vent support. Wean FiO2 as tolerated.  3. Venous thromboembolism prophylaxis with heparin subcutaneous.   CODE STATUS: The patient is a full code.   CRITICAL CARE TIME SPENT: 45 minutes.   ____________________________ Cletis Athens. Nile Prisk, MD dkh:lt D: 11/11/2013 22:18:36 ET T: 11/11/2013 23:23:55 ET JOB#: [id number]  cc: Cletis Athens. Tzion Wedel, MD, <Dictator> Rajesh Wyss Synetta Shadow MD ELECTRONICALLY SIGNED 11/12/2013 20:27

## 2014-10-12 NOTE — H&P (Signed)
PATIENT NAME:  [name], [name] MR#:  [id number] DATE OF BIRTH:  12/14/1970  DATE OF ADMISSION:  01/26/2014  PRIMARY CARE PHYSICIAN: None.  REFERRING PHYSICIAN: Loraine LericheMark R. Quale, MD  CHIEF COMPLAINT: Severe agitation with questionable seizures.  HISTORY OF PRESENT ILLNESS: [name] is a 44 year old with a history of alcohol and polysubstance abuse who was admitted in June of 2015 with similar complaints of seizures and altered mental status from polysubstance abuse. Patient had an episode of seizures. After that, patient became extremely agitated and called the police, who brought the patient to the Emergency Department. Patient was extremely agitated, with handcuffs. The patient received 18mg  of ativan. With that, patient was thought to be insecure from gastric secretions and the patient was intubated to protect the airway. Unable to obtain any history from the patient.  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 Epilepsy.   PAST SURGICAL HISTORY: None.  ALLERGIES: ASPIRIN, COCONUT, AND BEE STINGS.   HOME MEDICATIONS: None.  SOCIAL HISTORY: Per records from previous admission, used to live in [place]. Unable to obtain. However, known history of polysubstance abuse.   FAMILY HISTORY: Could not obtain from the patient.  REVIEW OF SYSTEMS: Could not be obtained from the patient.   PHYSICAL EXAMINATION: GENERAL: This is a well-built, well-nourished, age-appropriate male lying down in the bed not in distress. VITAL SIGNS: Temperature 99, pulse 92, blood pressure 155/140, respiratory rate of 16, oxygen saturation 100% on FiO2 of 60%. HEENT: Head normocephalic, atraumatic. There is no scleral icterus. Conjunctivae normal. Pupils equal and reactive to light. ET tube in place.  NECK: Supple. No lymphadenopathy. No JVD. No carotid bruit. CHEST: There is no focal tenderness. LUNGS: Bilaterally clear to auscultation. HEART: S1, S2 regular. No murmurs are heard. ABDOMEN: Bowel sounds  present. Soft, nontender, nondistended. No hepatosplenomegaly. EXTREMITIES: No pedal edema. Pulses 2+. SKIN: No rash or lesions. MUSCULOSKELETAL: Could not examine the patient. NEUROLOGIC: The patient is sedated, not oriented to place, person, and time.  LABORATORIES: 1.  CT, head without contrast reveals no acute intracranial abnormality. 2.  and leukocytosis. His urine drug screen is positive for marijuana and benzodiazepines. 3.  Alcohol level of 125. 4.  TSH 4.36. 5. CBC: WBC of 12.1. Rest of the values are within normal limits. 6.  CMP is completely within normal limits.  ASSESSMENT AND PLAN: [name] is a 44 year old male with a known history of alcohol and polysubstance abuse who comes to the Emergency Department brought in by the police for severe agitation. 1.  Seizures. Could be alcohol and medication induced. Patient will be admitted to the intensive care unit. Will try to wean off from the ventilator as patient follows up with the commands. Keep the patient on seizure precautions. A CT, head without contrast is unremarkable. The patient had a recent admission about 1 month back. 2.  Alcohol abuse. Keep the patient on thiamine. Continue to follow up for signs of alcohol withdrawal.  3.  Keep the patient on deep venous thrombosis prophylaxis with Lovenox.  TIME SPENT: 50 minutes of critical care time.   ____________________________ Susa GriffinsPadmaja Colbie Danner, MD pv:sk D: 01/27/2014 01:14:51 ET T: 01/27/2014 01:51:25 ET JOB#: [id number]  cc: Susa GriffinsPadmaja Gary Gabrielsen, MD, <Dictator> Clerance LavPADMAJA Syla Devoss MD ELECTRONICALLY SIGNED 01/27/2014 21:02

## 2014-10-12 NOTE — Discharge Summary (Signed)
PATIENT NAME:  [name], Vishal J MR#:  [id number] DATE OF BIRTH:  Nov 05, 1970  DATE OF ADMISSION:  01/26/2014 DATE OF DISCHARGE:  02/05/2014  ADMITTING DIAGNOSES:  1.  Severe agitation due to polysubstance abuse and alcohol withdrawal.  2.  Possible seizure.  3.  Alcohol abuse.  4.  Polysubstance abuse.    CONSULTATIONS:  1.  Erin FullingKurian Kasa MD, pulmonary/CCU. 2.  Margarita RanaSurya Challa MD. 3.  Freda MunroSaadat Khan MD, pulmonary/CCU. 4.  Brandy HaleUzma Faheem MD, psychiatry.   PROCEDURES: The patient was intubated on 01/27/2014. He was extubated 02/03/2014.   BRIEF HISTORY OF PRESENT ILLNESS: Mr. [name] is a 44 year old male with history of alcohol and polysubstance abuse admitted to this facility in June 2015 with similar complaints of seizures, altered mental status from polysubstance abuse. The patient had an episode of seizure. He became extremely agitated and called the police who brought the patient to the Emergency Department. The patient was extremely agitated and handcuffed in the Emergency Room. The patient received 18 mg of Ativan. The patient was then sedated, unable to protect his airway and intubated.   HOSPITAL COURSE AND TREATMENT:  1.  Extreme agitation and delirium due to polysubstance abuse and alcohol withdrawal. Upon admission the patient was excessively agitated, he was sedated and eventually became unable to protect his airway. He was intubated and sedated from August 9th to August 16th with attempts at weaning resulting in excessively violent behavior. He was extubated on August 16th without complication. He was maintained on a Precedex drip with Geodon and Ativan p.r.n.  On the day of transfer to behavioral health he is off of the Precedex drip. He is receiving Geodon 40 mg b.i.d. and Ativan 1 mg every hour as needed. He is currently calm. He needs further psychiatric assessment and treatment. 2.  Polysubstance abuse and alcohol abuse. He being transferred to behavioral health for further evaluation and  treatment.   PHYSICAL EXAMINATION: VITAL SIGNS: Temperature 98.2, pulse 86, respirations 17, blood pressure 134/86, pulse oximetry 98% on room air.  GENERAL: The patient is alert, sitting up, calm.  HEENT: Hearing is intact to voice. Oral mucosa dry. Oropharynx is otherwise clear.  RESPIRATORY: Normal respiratory effort with clear breath sounds with air movement.  CARDIOVASCULAR: Regular rate and rhythm, no murmurs, rubs, or gallops, no lower extremity edema, peripheral pulses are 2+.  ABDOMEN: Soft, nontender, no guarding, no rebound, bowel sounds are normal.  SKIN: Normal to palpation. No rashes. Multiple tattoos over the torso and arms.  NEUROLOGIC: Cranial nerves are intact. Motor and sensory function is intact.  PSYCHIATRIC: The patient is alert, oriented, calm; he is intermittently agitated, he has poor insight into his medical situation.   LABORATORY DATA: Sodium 142, potassium 3.5, chloride 106, bicarbonate 26, BUN 18, creatinine 0.63, white blood cell count 8.6, hemoglobin 13.3, platelets 326,000, MCV is 89.   CONDITION ON DISCHARGE: The patient is medically stable for transfer to behavioral health.   DISCHARGE MEDICATIONS: Will leave medication decisions up to psychiatry. The patient is not on any medications for chronic medical conditions. He is currently being treated with Geodon 40 mg q. 12 hours per psychiatry recommendations. He has been receiving Ativan, fentanyl, morphine for anxiety. He has been using a nicotine patch, 21 mg transdermal daily.    ____________________________ Ena Dawleyatherine P. Clent RidgesWalsh, MD cpw:lt D: 02/05/2014 14:26:12 ET T: 02/05/2014 15:14:47 ET JOB#: [id number]  cc: Santina Evansatherine P. Clent RidgesWalsh, MD, <Dictator> Gale JourneyATHERINE P Korine Winton MD ELECTRONICALLY SIGNED 02/14/2014 13:17

## 2014-10-12 NOTE — Consult Note (Signed)
PATIENT NAME:  [name], [name] MR#:  [id number] DATE OF BIRTH:  1970/11/23  DATE OF CONSULTATION:  11/12/2013  REFERRING PHYSICIAN:   CONSULTING PHYSICIAN:  Ayden Apodaca K. Guss Bundehalla, MD  LOCATION:  [facility] CCU-3; [place]   AGE:  43 years.  SEX:  Male.  RACE:  White.  SUBJECTIVE:  I came to see the patient in consultation. The patient has been intubated, so the undersigned could not do any interview. According to information obtained from the staff nurse, the patient's girlfriend told her that the patient is a compulsive liar, and he told the girlfriend that he has been diagnosed with a brain tumor 5 months ago. According to information obtained from the chart the patient seems to have seizure disorder. The patient is in respiratory failure and is being intubated.   PAST PSYCHIATRIC HISTORY:  None available.  MENTAL STATUS:  Not done, as the patient has been intubated.   RECOMMENDATIONS:  Please reconsult the patient when he is able to give information.    ____________________________ Jannet MantisSurya K. Guss Bundehalla, MD skc:ms D: 11/12/2013 18:12:51 ET T: 11/12/2013 19:05:22 ET JOB#: [id number]  cc: Monika SalkSurya K. Guss Bundehalla, MD, <Dictator> Beau FannySURYA K Tamico Mundo MD ELECTRONICALLY SIGNED 11/15/2013 8:07

## 2014-10-12 NOTE — Consult Note (Signed)
PATIENT NAME:  [name] DATE OF BIRTH:  19-Jun-1971  DATE OF CONSULTATION:  02/07/2014  REFERRING PHYSICIAN:  Dr. Jennet MaduroPucilowska.  CONSULTING PHYSICIAN:  Susa GriffinsPadmaja Mckinzee Spirito, MD  REASON FOR CONSULT: Altered mental status, high blood pressure.    HISTORY OF PRESENT ILLNESS: [name] is a s a 44 year old male with history of polysubstance abuse was admitted in end of May and beginning of June with severe agitation requiring intubation and requiring high dose sedative medications and again admitted on August 8th with similar symptoms of questionable seizures followed by altered mental status and severe agitation. The patient was brought to the Emergency Department by the police in restraints. The patient required multiple doses of medications to calm him down. The patient was intubated and admitted to the Intensive Care Unit for 10 days. The patient was extubated on the 16th of August  2015 because of drug detox. At the time of discharge the patient was requiring 1 mg of Ativan as well as Geodon. Since the admission the patient has been having episodes of high blood pressure and periods of altered mental status. About 10:00, the patient received 1 mg of Ativan and around 12:00 Librium, Neurontin, Tegretol. Around 12:30 midnight The patient was found to have staring looks, noted seizures were observed. After sometime, the patient's mental status completely returned back answered the questions appropriately. The patient's blood pressure at the time noted was 170/102. Prior to giving the Ativan the patient's blood pressure 105/60. Concerning about the patient's seizures, the patient underwent EEG which did not show any seizure type activity.   LABORATORY DATA: Work-up seems to be completely unremarkable. Two days back the patient had episodes of nausea and vomiting; however, today, the patient tolerated his diet well. The patient denies having any nausea, vomiting, abdominal pain. Denies having  any cough, shortness of breath.   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   PAST SURGICAL HISTORY: None.   ALLERGIES: ASPIRIN, COCONUT AND BEE STINGS.   HOME MEDICATIONS: None.   CURRENT MEDICATIONS: 1. Tegretol 200 mg b.i.d.  2. Librium 25 mg q. 6 h.  3. Gabapentin 300 mg t.i.d.  4. Protonix 40 mg in the morning.   P.R.N.  medications: 1. Ativan 2 mg IM for anxiety.   2. Diphenhydramine IM every 6 hours as needed.  3. Maalox as needed for indigestion.  4. Acetaminophen 650 mg every 4 hours as needed.   SOCIAL HISTORY: Per previous records, [place] in [place]. Has history of polysubstance abuse.   FAMILY HISTORY: Could not be obtained from the patient.   REVIEW OF SYSTEMS:  CONSTITUTIONAL: Experiences generalized weakness.  EYES: No change in vision.  EARS, NOSE AND THROAT:  No change in hearing.  RESPIRATORY: No cough, shortness of.  CARDIOVASCULAR: No chest pain, palpations.  GASTROINTESTINAL: No nausea, vomiting, abdominal pain. Had 2 days back, nausea and vomiting.  GENITOURINARY: No dysuria or hematuria. Marland Kitchen.  HEMATOLOGIC: No easy bruising or bleeding.  SKIN: No rash or lesions.  MUSCULOSKELETAL: No joint pains and aches.  NEUROLOGIC: No weakness or numbness in any part of the body.   PHYSICAL EXAMINATION:  GENERAL: This is a well-built, well-nourished, age-appropriate male lying down in the bed, not in distress.  VITAL SIGNS: Temperature 97.8, pulse 115, blood pressure 180/120, respiratory rate of 18, oxygen saturation is 95% on room air.  HEENT: Head normocephalic, atraumatic. There is no scleral icterus. Conjunctivae normal. Pupils equal  and reactive. Mucous membranes moist. No pharyngeal erythema.   NECK: Supple. No lymphadenopathy. No JVD. No carotid bruit.  CHEST: Has no focal tenderness.  LUNGS: Bilaterally clear to auscultation.  HEART: S1 and S2 regular. No murmurs are heard.  ABDOMEN: Bowel  sounds present. Soft. Has tenderness in the epigastric and left upper quadrant area. No rebound or guarding.  EXTREMITIES: No pedal edema. Pulses 2+.  SKIN: No rash or lesions.  MUSCULOSKELETAL: Good range of motion in all the extremities.  NEUROLOGIC: The patient is alert, oriented to self. Did not answer if the patient is oriented to place and time. The cranial nerves II through XII intact. Motor 5/5 in upper and lower extremities.   LABORATORIES: CMP is completely within normal limits.   CBC is completely within normal limits.   ASSESSMENT AND PLAN: [name] is a 44 year old male comes with questionable seizures, was found to be in altered mental status.   1. Altered mental status for short duration, resolved by itself. The patient also received multiple sedative medications just prior to this event. Ativan, Tegretol and Gabapentin. The patient had EEG done did not show any epileptiform activity. Considered consulting neurology. Continue the Tegretol for now.  2. Hypertension, poorly controlled. Nursing staff state that the patient was visit ed by his girlfriend. We will also check the urine drug screen. Keep the patient on Norvasc 5 mg p.o. daily.  3. Polysubstance abuse. Will need further counseling with the patient.  4. Considering the patient's severe agitation requiring frequent medications the patient would be safe on the behavioral unit. We will continue to watch the patient closely as well as daily follow-up. We will also consider getting the neurology consult.   Thank you for the consultation.   TIME SPENT: 55 minutes.    ____________________________ Susa Griffins, MD pv:JT D: 02/07/2014 02:20:58 ET T: 02/07/2014 06:03:28 ET JOB#: [id number]  cc: Susa Griffins, MD, <Dictator> Susa Griffins MD ELECTRONICALLY SIGNED 02/09/2014 0:51

## 2014-10-12 NOTE — Consult Note (Signed)
Brief Consult Note: Diagnosis: polysubstance abuse, delirium secondary to medical condition/medications/withdrawal.   Patient was seen by consultant.   Recommend further assessment or treatment.   Discussed with Attending MD.   Comments: Pt admitted after seizure episode in the context of tx noncompliance. There is a h/o substance abuse which the patient denies today. He is still confused at times, unable to understand treatment options.   Psychiatry will follow along.  Electronic Signatures: Kristine LineaPucilowska, Lonnell Chaput (MD)  (Signed 01-Jun-15 15:39)  Authored: Brief Consult Note   Last Updated: 01-Jun-15 15:39 by Kristine LineaPucilowska, Sonia Bromell (MD)
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
## 2014-10-12 NOTE — H&P (Signed)
PATIENT NAME:  [name], KUNZ MR#:  [id number] DATE OF BIRTH:  01-23-1971  DATE OF ADMISSION:  02/05/2014 DATE OF ASSESSMENT:  02/06/2014  REFERRING PHYSICIAN: Padmaja P. Vasireddy, MD ATTENDING PHYSICIAN:  Jolanta B. Jennet Maduro, MD  IDENTIFYING DATA: [name] is a 44 year old male with a history of polysubstance dependence and seizures.   CHIEF COMPLAINT: "I'm fine."  HISTORY OF PRESENT ILLNESS: [name] was admitted to the hospital on August 8 for presumed episode of seizures with severe agitation that required multiple doses of Ativan, after which he had to be intubated. He stayed on the respirator for 10 days. Following extubation, the patient was rapidly transferred to psychiatry for further treatment of substance abuse. The patient initially is still a little confused and even hallucinating, seeing a kitten in the room, but improves quickly during the day.  In the CCU, he was maintained on multiple doses of morphine, fentanyl and Ativan. It is quite possible that he needs detox from all the medication that he received in the hospital. He denies heavy substance use. He believes that he had seizures like many times in the past, as he has been off his seizure medication.  He responds well to a combination of Tegretol, Dilantin and phenobarbital but has not been taking medicines in probably a year.  He relocated from [place] to [place] 6-7 months ago and has not been able to establish any care in our area. He lost his Medicaid when his kids grew up and has no health insurance. He was hospitalized in a very similar scenario at the end of May, beginning of June of this year when he also required intubation. It was presumed that the patient had alcohol withdrawal seizures. He does have a long history of substance use. He is very well versed with heroin and other substances but apparently has not been using much of alcohol substances lately. There is also a history of phenobarbital misuse.  The  patient denies any symptoms of depression or anxiety.  He does not have a history of psychosis, even though he is confused and hallucinating today.  PAST PSYCHIATRIC HISTORY:  Has never been hospitalized in psychiatry.  No treatment for substance abuse.  No outpatient treatment. No suicide attempts. He reports that he is never seizure-free, even when he takes medications, and the longest time when he was seizure free was 111 days. There is a history of substance use, including IV heroin use, but he was positive only for benzodiazepines and a touch of alcohol on initial assessment.  FAMILY AND PSYCHIATRIC HISTORY:  He was adopted.   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   ALLERGIES: ASPIRIN, HALDOL, COCONUT AND BEE STINGS.   MEDICATIONS ON ADMISSION: None.   SOCIAL HISTORY: He is originally from [facility]. He used to live in [place]. 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  He was in a relationship of 11 years, but his partner committed suicide. He has 2 kids who still live in [place].  He relocated to [facility] and has a girlfriend here. She has been very supportive.  REVIEW OF SYSTEMS:  CONSTITUTIONAL: No fevers or chills. No weight changes.  EYES: No double or blurred vision.  ENT: No hearing loss.  RESPIRATORY: No shortness of breath or cough.  CARDIOVASCULAR: No chest pain or orthopnea.  GASTROINTESTINAL: No abdominal pain, nausea, vomiting, or diarrhea.  GENITOURINARY: No incontinence or frequency.  ENDOCRINE: No heat or cold intolerance.  LYMPHATIC: No anemia or easy bruising.  INTEGUMENTARY: No acne or rash.  MUSCULOSKELETAL: No muscle or joint pain.  NEUROLOGIC: No tingling or weakness. Positive for seizure disorder.  PSYCHIATRIC: See history of present illness for details.   PHYSICAL EXAMINATION:  VITAL SIGNS: Blood pressure 156/107, pulse 120, respirations 20, temperature 98.2.  GENERAL: This is a well-developed male,  slightly confused but no agitation this morning.  HEENT: The pupils are equal, round and reactive to light.  Sclerae anicteric.  NECK: Supple. No thyromegaly.  LUNGS: Clear to auscultation. No dullness to percussion.  HEART: Regular rhythm and rate. No murmurs, rubs, or gallops.  ABDOMEN: Soft, nontender, nondistended. Positive bowel sounds.  MUSCULOSKELETAL: Normal muscle strength in all extremities.  SKIN: No rashes or bruises.  LYMPHATIC: No cervical adenopathy.  NEUROLOGIC: Cranial nerves II through XII are intact.   LABORATORY DATA: Chemistries are within normal limits. CBC within normal limits. On admission, urine tox screen was positive for benzodiazepines and cannabinoids. Blood alcohol level on admission 0.121.  MENTAL STATUS EXAMINATION ON ADMISSION: The patient is alert and oriented to person and place. He has no idea how he ended up in the hospital.  He thinks that this is for seizures just because this is where he always comes to the hospital. He claims to recognize me from our encounter at the beginning of June; I doubt that this is true. He is pleasantly confused and thinks that there is a man in the house in the room with a kitten. Insists that this is so. He is adequately groomed, wearing hospital scrubs. He is in a wheelchair. He is too unsteady on his feet to allow him to walk. He has a Comptroller in the room. He maintains good eye contact. His speech is soft. Mood is fine with full affect. Thought process is influenced by paranoid delusions and hallucinations. He denies thoughts of hurting himself or others. His cognition is impossible to assess. The patient is delirious and confused.  From the previous encounter, the patient is of average intelligence and fund of knowledge. His insight and judgment are extremely poor now.  SUICIDE RISK ASSESSMENT ON ADMISSION: This is a patient with a long history of seizure and substance abuse, transferred from medical floor after an extended period  of intubation following seizure disorder or alcohol/benzodiazepine withdrawal seizures, who is too confused to plan or execute suicide attempt but requires supervision and support.   DIAGNOSES: AXIS I: Polysubstance dependent, delirium secondary to medical condition of medication withdrawal.  AXIS II: Deferred.  AXIS III: Seizure disorder.  AXIS IV: Substance abuse. Employment, financial, housing, access to care, relationship, primary support.  AXIS V: Global assessment of functioning 35.   PLAN: The patient was admitted to [facility] Medicine Unit for safety, stabilization, and medication management. He was initially placed on suicide precautions and was closely monitored for any unsafe behaviors. He underwent full psychiatric and risk assessment. He received pharmacotherapy, individual and group psychotherapy, substance abuse counseling, and support from therapeutic milieu.  1.  Delirium.  The patient is on Librium taper. 2.  Substance abuse.  The patient is in no shape to discuss it.  3.  Seizure disorder. He was consulted by neurology while in CCU. EEG was ordered but never performed. We will try to obtain EEG and a followup neuro consult.  It is not impossible that the patient's major problem is seizure disorder, and as long as he is not taking his medicines, he will continue returning  to our Emergency Room. Substance abuse may be a secondary problem.  4.  Deconditioning.  We will ask PT for assessment today.  In spite of having a sitter and being in the wheelchair, the patient fell gently on the floor and apparently no damage done.   DISPOSITION: Most likely with the girlfriend that someone mentioned to me that she has 50B against him now.  We will have to investigate.     ____________________________ Ellin GoodieJolanta B. Jennet MaduroPucilowska, MD jbp:DT D: 02/06/2014 14:58:00 ET T: 02/06/2014 15:25:01 ET JOB#: [id number]  cc: Jolanta B. Jennet MaduroPucilowska, MD, <Dictator> Shari ProwsJOLANTA B  PUCILOWSKA MD ELECTRONICALLY SIGNED 02/12/2014 19:11

## 2014-10-12 NOTE — Discharge Summary (Signed)
PATIENT NAME:  Justin FillersSCOTT, Jennie J MR#:  [id number] DATE OF BIRTH:  April 16, 1971  DATE OF ADMISSION:  11/11/2013 DATE OF DISCHARGE:  11/20/2013  Patient signed against medical advice so no discharge instructions were given.  DIAGNOSES:   At that time, in the hospital: 1. Metabolic encephalopathy and altered mental status due to drug withdrawal issues, improved.  2. Acute respiratory failure due to recurrent seizures, noncompliance to seizure medication, improved.  3. Sepsis with infiltrate on chest x-ray is pneumonia.  4. Noncompliance to seizure medication.  5. Emphysema evident on CT spine.  6. Positive hepatitis C test.   HISTORY OF PRESENTING ILLNESS AND HOSPITAL COURSE:  A 44 year old Caucasian gentleman with past medical history of seizure presenting with seizure, multiple episodes. Came to Emergency Room earlier in the day, had 2-3 seizures 3 days ago and he signed out against medical advice. Later on, he again had 2 or 3 episodes so brought to the Emergency Room. The patient was confused after seizing in hospital. Was given total 4 mg of Versed by EMS and also noted to be shallow breathing with marked confusion, so subsequently intubated for airway protection.   HOSPITAL COURSE AND STAY:   1. Patient remained intubated, long course, because of severe agitation whenever we were trying to extubate him. Meanwhile also developed some sepsis and found having pneumonia on chest x-ray so was on broad-spectrum antibiotics. Neurology consult was called in to adjust the seizure medication and psychiatry consult to address noncompliance issues. Successfully extubated on May 31st.   2. For sepsis, was also on Levophed drip initially, but later on improved and came off. Blood cultures remained negative and antibiotic changed to Levaquin as per the cultures. CT scan of the spine was showing emphysema, but there was no wheezing so did not treat this issue.  3. Hepatitis C positive.  It was chronic hepatitis C  and counseled patient to follow with the GI doctor when patient was signing against medical advise.  Again counseled him to go to one of the GI doctors and start following for his hepatitis C chronic issue.   IMPORTANT LABORATORY RESULTS IN THE HOSPITAL: WBC 8.6, hemoglobin 16.1, platelet count is 267,000.  Tegretol level in the blood was less than 0.5 on presentation. Urinalysis was negative. Urine for toxicology was positive for cannabinoid, barbiturates, and benzodiazepines.  HIV 1/2 test was done which was negative. Hepatitis A, B, and C were sent which showed hepatitis C antibody high. Dilantin level next came up to 21.7 after giving IV loading dose of Dilantin. Chest x-ray, portable, single view, on May 26th,  increasing bilateral infiltrates, lower lobe airspace opacity, left greater than right. Blood cultures no growth. Sputum culture: Heavy growth of strep pneumoniae which was sensitive to levofloxacin and group C streptococcal sensitive to levofloxacin. WBC count went up to 16,000. Came down to 7.8 on May 28th.   TIME SPENT ON THIS DISCHARGE SUMMARY: 30 minutes.    ____________________________ Hope PigeonVaibhavkumar G. Elisabeth PigeonVachhani, MD vgv:dd D: 11/27/2013 16:53:13 ET T: 11/27/2013 20:32:39 ET JOB#: [id number]  cc: Hope PigeonVaibhavkumar G. Elisabeth PigeonVachhani, MD, <Dictator> Altamese DillingVAIBHAVKUMAR Charlot Gouin MD ELECTRONICALLY SIGNED 12/05/2013 9:02

## 2014-10-12 NOTE — Consult Note (Signed)
Brief Consult Note: Diagnosis: delirium related to substance abuse and withdrawl.   Comments: Psychiatry: Just a note that I am aware of the patient and the consult ordered on the eighth. Yesterday and today patient intubated. Please reenter consult if help still requested after he is extubated. Thanks.  Electronic Signatures: Audery Amellapacs, Asya Derryberry T (MD)  (Signed 11-Aug-15 15:49)  Authored: Brief Consult Note   Last Updated: 11-Aug-15 15:49 by Audery Amellapacs, Danali Marinos T (MD)

## 2014-10-12 NOTE — Consult Note (Signed)
PATIENT NAME:  [name], [name] MR#:  [id number] DATE OF BIRTH:  Mar 27, 1971  DATE OF CONSULTATION:  02/02/2014  REFERRING PHYSICIAN:   CONSULTING PHYSICIAN:  Ladaysha Soutar K. Guss Bundehalla, MD  PLACE OF DICTATION:  [facility] Room #CCU5, [place], [place].  AGE:  43 years.  SEX:  Male.  RACE:  White.  SUBJECTIVE:  Patient was seen in consultation [facility], [place], [place] .  Patient is a 44 year old white male who is well known to the staff in CCU, who stated that the patient abuses poly substances from the street and this time he has been abusing bath salts.  According to information obtained from the chart, he had been combative and threatening to himself and staff, and has been abusing salts prior to hospitalization and girlfriend thought he is dangerous to himself and others, and brought him here for help.    PAST PSYCHIATRIC HISTORY:  Staff reports a month ago he came for a similar episode when he was abusing street drugs, and after he got off the ventilator and he became alert, he starting cursing staff and yelling at them, and using foul language.  Patient was allowed to discharge AMA and here he comes back again in 1 month with the same problems.    MENTAL STATUS EXAMINATION:  Windy CarinaUndersign went to the room to evaluate him.  Patient was sleeping and about going to detox and he is arousable, but he did not talk much with the undersigned.  He went back to sleep immediately.  Further mental status was not done.    RECOMMENDATION:  Continue current treatment, and after patient gets extubated and medically cleared, and medically stabilized, he can be transferred to behavioral health unit for further help with substance abuse where he will learn how to deal with substance abuse issues and get help with the same.  An order has been put in.   ____________________________ Jannet MantisSurya K. Guss Bundehalla, MD skc:TT D: 02/02/2014 18:28:14 ET T: 02/02/2014 19:02:40 ET JOB#: [id number]  cc: Monika SalkSurya K. Guss Bundehalla, MD,  <Dictator> Beau FannySURYA K Coye Dawood MD ELECTRONICALLY SIGNED 02/03/2014 16:07
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
## 2014-10-12 NOTE — Consult Note (Signed)
Brief Consult Note: Diagnosis: Delirium related to Substance use and w/d.   Patient was seen by consultant.   Recommend further assessment or treatment.   Orders entered.   Discussed with Attending MD.   Comments: Pt seen in ICU. He was sitting in the bed and appeared somewhat restless and asking to be discharged. He stated that his mouth feels dry and he does not know why he came to the hospital. Staff reported that he is becoming intermittently agitated and has to be given meds for the same. He was given meds  this am to control his behavior. He was minimizing his use of drugs and does not using bath salts and stated that his girlfriend was using the same. Discussed care with attending, Dr Clent RidgesWalsh and he will be given prn meds at this time.   Plan Pt continues to be on IVC  [facility] he is medically stable He was given Zyprexa 2763m IM for agitation Continue Lorazepam prn Will continue to monitor.  Electronic Signatures: Rhunette CroftFaheem, Radha Coggins S (MD)  (Signed 18-Aug-15 13:03)  Authored: Brief Consult Note   Last Updated: 18-Aug-15 13:03 by Rhunette CroftFaheem, Advika Mclelland S (MD)

## 2014-10-12 NOTE — Consult Note (Signed)
Brief Consult Note: Diagnosis: Delirium secondary to medical condition/medications/withdrawal.   Patient was seen by consultant.   Consult note dictated.   Recommend further assessment or treatment.   Orders entered.   Discussed with Attending MD.   Comments: Pt admitted after seizure episode in the context of tx noncompliance. There is a h/o substance abuse which the patient denies again today. He denies alcohol, benzodiazepine or narcotics use. He denies any symptoms of depression, anxiety or psychosis. He is not interesed in pharmacotherapy or substanmce abuse treatment. He is alert and fully oriented. He is not suicidal or homicidal.   Heart rate is still elevated and he vomitted several time today including during my interview. He did receive ativan and morphine today. He reports that he did best on a combination of Dilantin, Tegretol and Phanobarbital having 111 days seizure free. Off medications for 5 months.  PLAN: 1. The patient does not meet criteria for IVC. Please discharge as appropriate.  2. I will add Tegretol to his medications list.  3. ALAMAP application provided by SW.  4. The [facility]. He refuses today.  Psychiatry will follow along.  Electronic Signatures: Kristine LineaPucilowska, Kayton Ripp (MD)  (Signed 02-Jun-15 14:54)  Authored: Brief Consult Note   Last Updated: 02-Jun-15 14:54 by Kristine LineaPucilowska, Nasiah Lehenbauer (MD)

## 2015-09-17 ENCOUNTER — Encounter: Payer: Self-pay | Admitting: *Deleted

## 2015-09-17 ENCOUNTER — Emergency Department

## 2015-09-17 ENCOUNTER — Emergency Department
Admission: EM | Admit: 2015-09-17 | Discharge: 2015-09-17 | Disposition: A | Discharge status: Home / Self Care | Attending: Emergency Medicine | Admitting: Emergency Medicine

## 2015-09-17 DIAGNOSIS — S0012XA Contusion of left eyelid and periocular area, initial encounter: Secondary | ICD-10-CM | POA: Diagnosis not present

## 2015-09-17 DIAGNOSIS — S29002A Unspecified injury of muscle and tendon of back wall of thorax, initial encounter: Secondary | ICD-10-CM | POA: Insufficient documentation

## 2015-09-17 DIAGNOSIS — S0033XA Contusion of nose, initial encounter: Secondary | ICD-10-CM | POA: Diagnosis not present

## 2015-09-17 DIAGNOSIS — Y998 Other external cause status: Secondary | ICD-10-CM | POA: Insufficient documentation

## 2015-09-17 DIAGNOSIS — S0083XA Contusion of other part of head, initial encounter: Secondary | ICD-10-CM | POA: Insufficient documentation

## 2015-09-17 DIAGNOSIS — Z23 Encounter for immunization: Secondary | ICD-10-CM | POA: Insufficient documentation

## 2015-09-17 DIAGNOSIS — Y92148 Other place in prison as the place of occurrence of the external cause: Secondary | ICD-10-CM | POA: Diagnosis not present

## 2015-09-17 DIAGNOSIS — S199XXA Unspecified injury of neck, initial encounter: Secondary | ICD-10-CM | POA: Diagnosis not present

## 2015-09-17 DIAGNOSIS — Y9389 Activity, other specified: Secondary | ICD-10-CM | POA: Insufficient documentation

## 2015-09-17 DIAGNOSIS — S0990XA Unspecified injury of head, initial encounter: Secondary | ICD-10-CM | POA: Diagnosis present

## 2015-09-17 DIAGNOSIS — S00411A Abrasion of right ear, initial encounter: Secondary | ICD-10-CM | POA: Insufficient documentation

## 2015-09-17 DIAGNOSIS — T07XXXA Unspecified multiple injuries, initial encounter: Secondary | ICD-10-CM

## 2015-09-17 DIAGNOSIS — S3992XA Unspecified injury of lower back, initial encounter: Secondary | ICD-10-CM | POA: Insufficient documentation

## 2015-09-17 MED ORDER — ONDANSETRON 4 MG PO TBDP
4.0000 mg | ORAL_TABLET | Freq: Once | ORAL | Status: AC
  Administered 2015-09-17: 4 mg via ORAL
  Filled 2015-09-17: qty 1

## 2015-09-17 MED ORDER — HYDROMORPHONE HCL 1 MG/ML IJ SOLN
1.0000 mg | Freq: Once | INTRAMUSCULAR | Status: AC
Start: 2015-09-17 — End: 2015-09-17
  Administered 2015-09-17: 1 mg via INTRAMUSCULAR
  Filled 2015-09-17: qty 1

## 2015-09-17 MED ORDER — TRAMADOL HCL 50 MG PO TABS: 50.0000 mg | ORAL_TABLET | Freq: Four times a day (QID) | ORAL | Status: AC | PRN

## 2015-09-17 MED ORDER — TETANUS-DIPHTH-ACELL PERTUSSIS 5-2.5-18.5 LF-MCG/0.5 IM SUSP
0.5000 mL | Freq: Once | INTRAMUSCULAR | Status: AC
  Administered 2015-09-17: 0.5 mL via INTRAMUSCULAR
  Filled 2015-09-17: qty 0.5

## 2015-09-17 MED ORDER — BACITRACIN ZINC 500 UNIT/GM EX OINT: TOPICAL_OINTMENT | Freq: Once | CUTANEOUS | Status: DC

## 2015-09-17 MED ORDER — TRAMADOL HCL 50 MG PO TABS
50.0000 mg | ORAL_TABLET | Freq: Once | ORAL | Status: AC
  Administered 2015-09-17: 50 mg via ORAL
  Filled 2015-09-17: qty 1

## 2015-09-17 MED ORDER — BACITRACIN ZINC 500 UNIT/GM EX OINT
TOPICAL_OINTMENT | CUTANEOUS | Status: AC
  Filled 2015-09-17: qty 0.9
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
## 2015-09-17 NOTE — ED Notes (Signed)
Pt in custody, sheriff at bedside
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
## 2015-09-17 NOTE — ED Notes (Signed)
Patient with no complaints at this time. Respirations even and unlabored. Skin warm/dry. Discharge instructions reviewed with patient at this time. Patient given opportunity to voice concerns/ask questions. Patient discharged at this time and left Emergency Department with steady gait.   

## 2015-09-17 NOTE — ED Notes (Signed)
Pt arrives in sheriff dept custody, in handcuffs, pt inmate

## 2015-09-17 NOTE — ED Notes (Signed)
Cleaned pt's R ear and applied bacitracin per MD order.  Pt tolerated well.

## 2016-01-16 IMAGING — CT CT HEAD WITHOUT CONTRAST
4 of 6 series · 16 of 47 positions shown, 19 images · non-contrast
Comparison: None.

CLINICAL DATA: Seizure. Head trauma. Behavioral issues. Possible
cervical spine trauma.

EXAM:
CT HEAD WITHOUT CONTRAST
CT CERVICAL SPINE WITHOUT CONTRAST
TECHNIQUE: Multidetector CT imaging of the head and cervical spine was
performed following the standard protocol without intravenous
contrast. Multiplanar CT image reconstructions of the cervical spine
were also generated.

[Series 5: c spine soft · axial · 0.33mm/px · z∈[-312,-290]mm · 2 of 85 slices shown]
[im 11/85  brain]
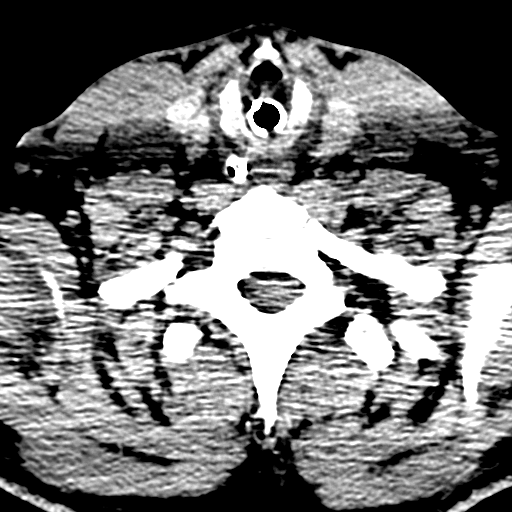
[im 22/85  brain]
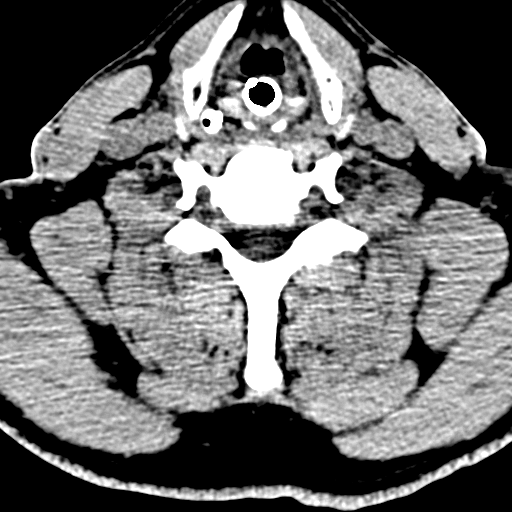

[Series 8: sag bone · sagittal · 0.36mm/px · 5 of 49 slices shown, 6 images]
[im 9/49  bone]
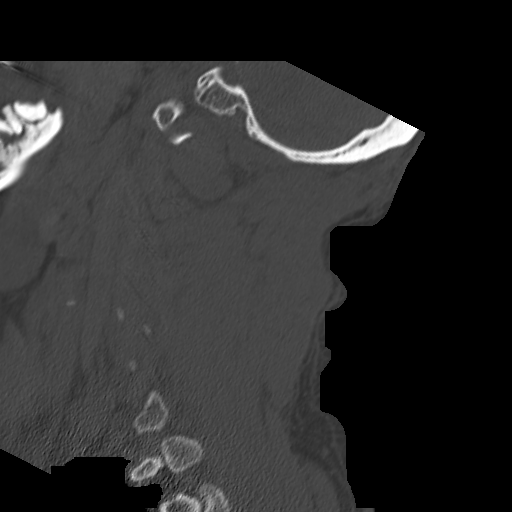
[im 17/49  bone]
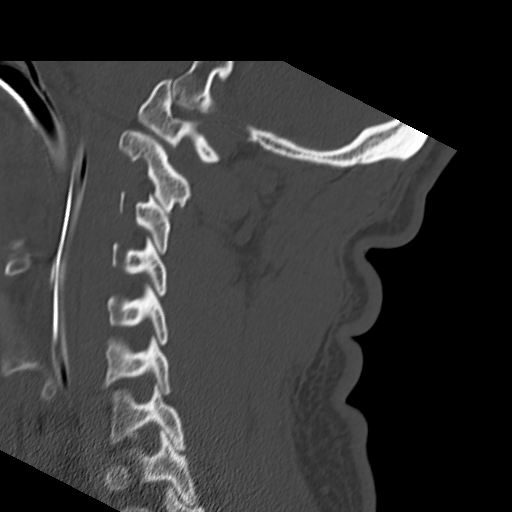
[im 25/49  brain]
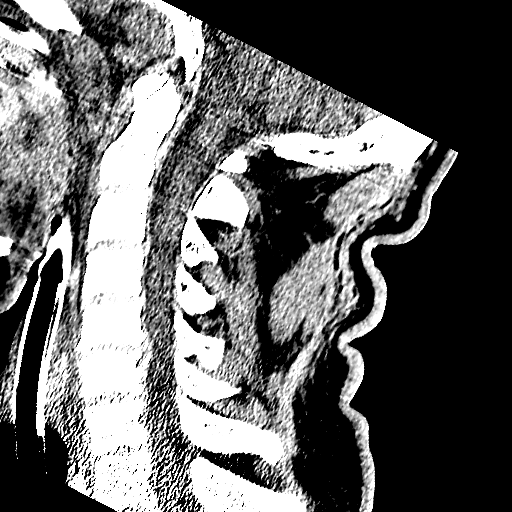
[im 25/49  bone]
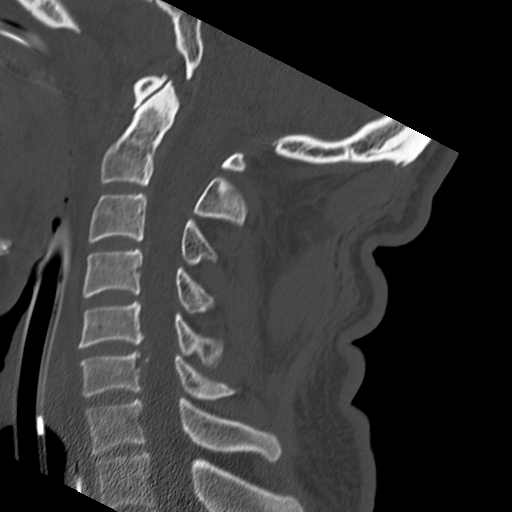
[im 33/49  bone]
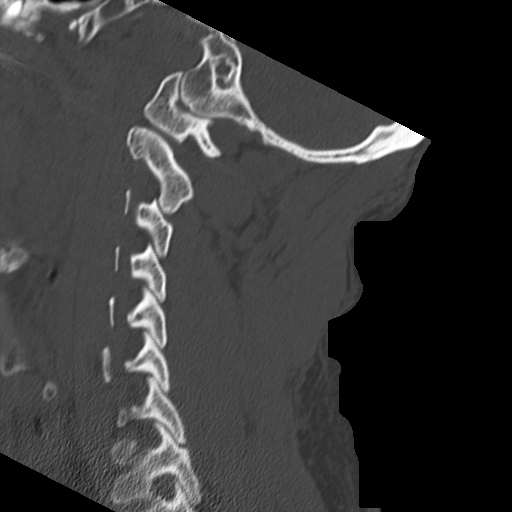
[im 41/49  bone]
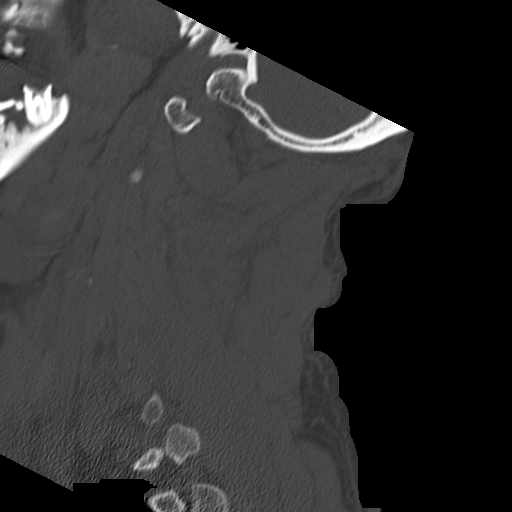

[Series 9: cor bone · coronal · 0.36mm/px · 3 of 45 slices shown]
[im 15/45  bone]
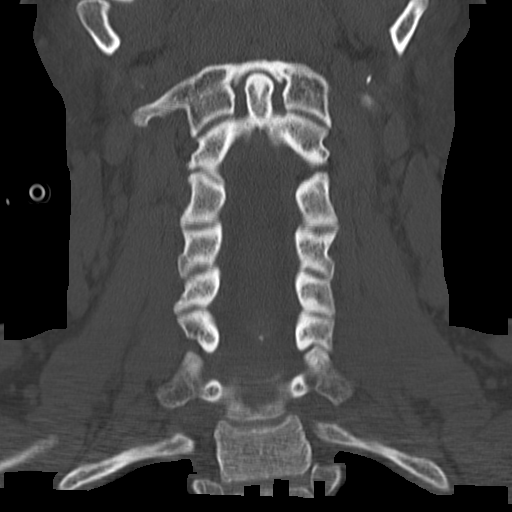
[im 20/45  bone]
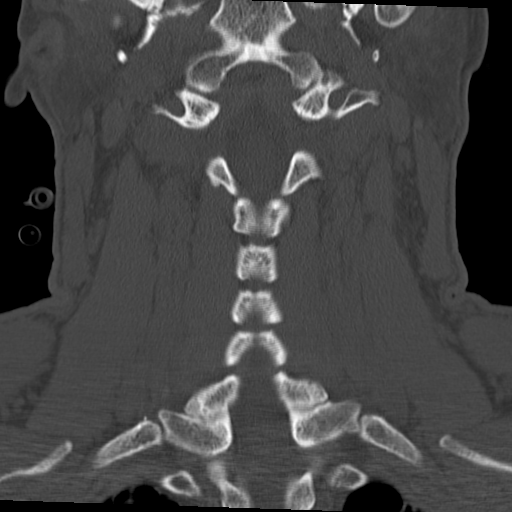
[im 25/45  bone]
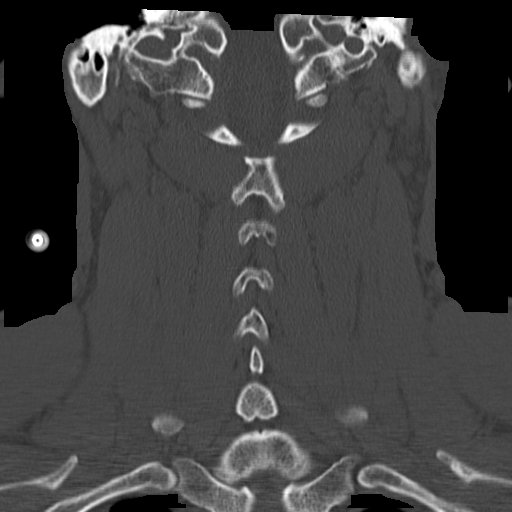

[Series 10: orthogonal axials · axial · 0.29mm/px · z∈[-337,-238]mm · 6 of 78 slices shown, 8 images]
[im 12/78  brain]
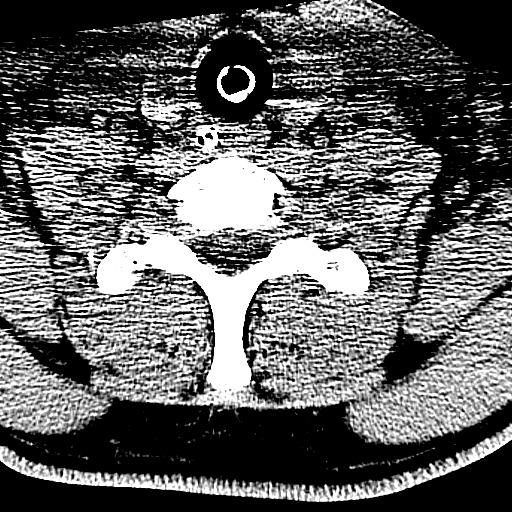
[im 12/78  bone]
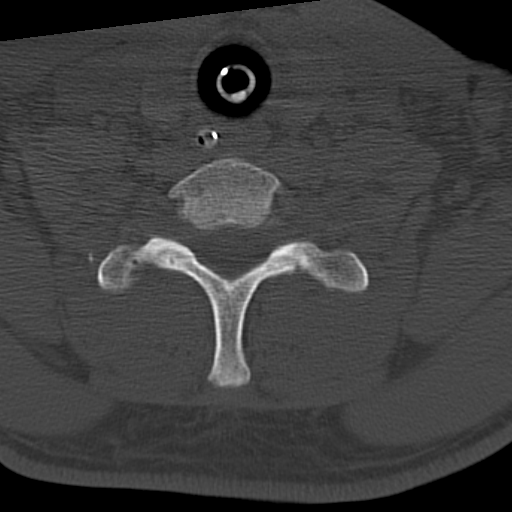
[im 23/78  bone]
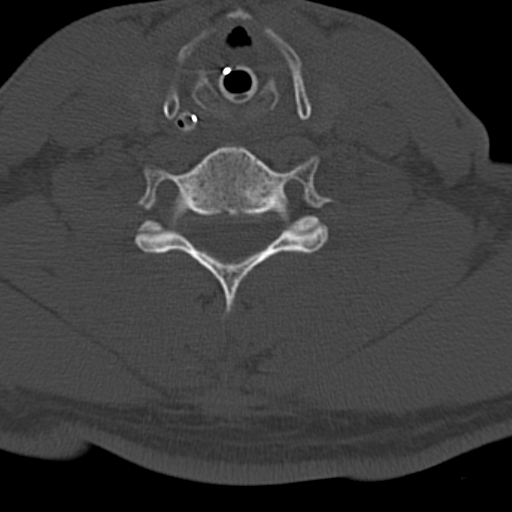
[im 34/78  bone]
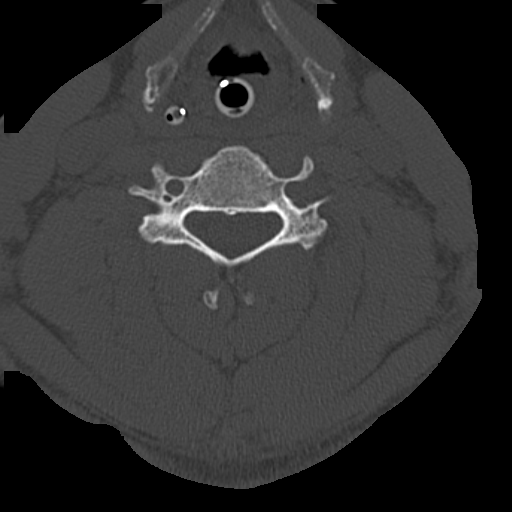
[im 45/78  bone]
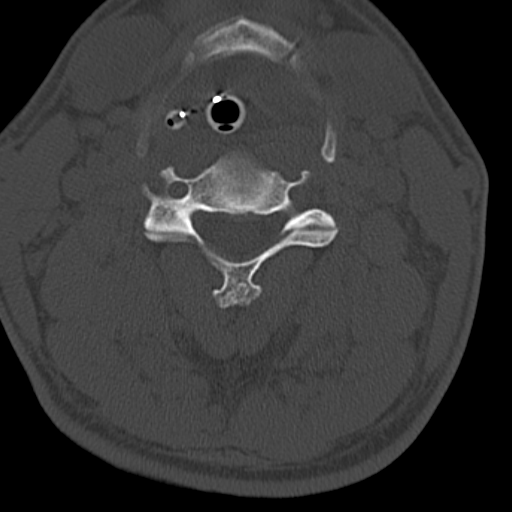
[im 56/78  brain]
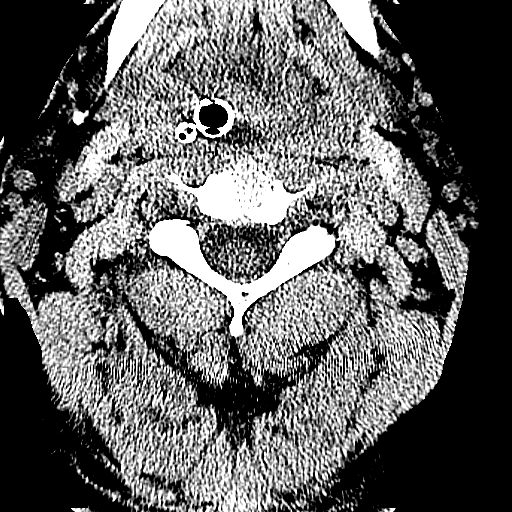
[im 56/78  bone]
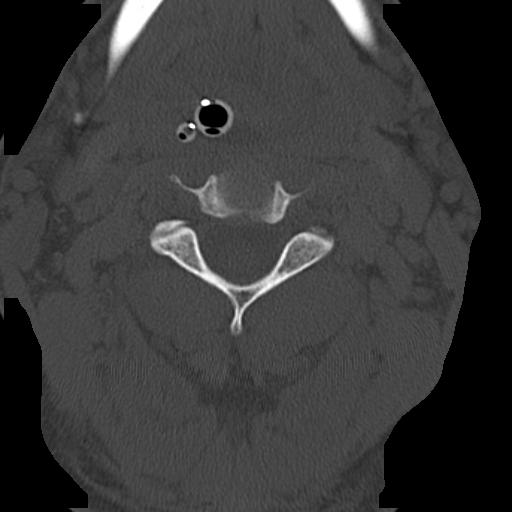
[im 67/78  bone]
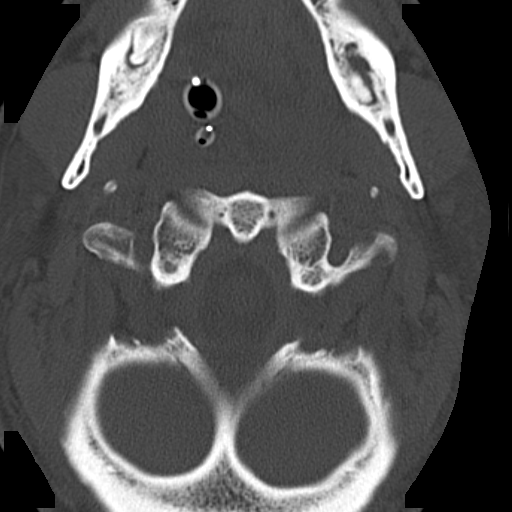

[16 of 47 positions shown; findings below may reference images not displayed]

FINDINGS: CT HEAD FINDINGS

The patient is intubated.

No evidence for acute infarction, hemorrhage, mass lesion,
hydrocephalus, or extra-axial fluid. There is no atrophy or white
matter disease. No sinus or mastoid fluid is evident. Calvarium is
intact.

CT CERVICAL SPINE FINDINGS

There is no visible cervical spine fracture, traumatic subluxation,
prevertebral soft tissue swelling, or intraspinal hematoma.
Intervertebral disc spaces are fairly well-preserved. There is a
central protrusion at C5-C6 which could result in impingement. There
are no neck masses. There is severe emphysema.
IMPRESSION: Unremarkable CT head without contrast. No acute intracranial
abnormality. No intracranial mass lesion is evident.

No evidence for cervical spine fracture or traumatic subluxation.
Cervical spondylosis with central disc protrusion C5-C6.

Severe emphysema.

## 2016-01-20 IMAGING — CR DG CHEST 1V PORT
1 series · 2 of 2 positions shown · non-contrast
Comparison: November 13, 2013

CLINICAL DATA: Hypoxia

EXAM:
PORTABLE CHEST - 1 VIEW

[Series 1: ap · 0.17mm/px · 2 of 2 slices shown]
[im 1/2]
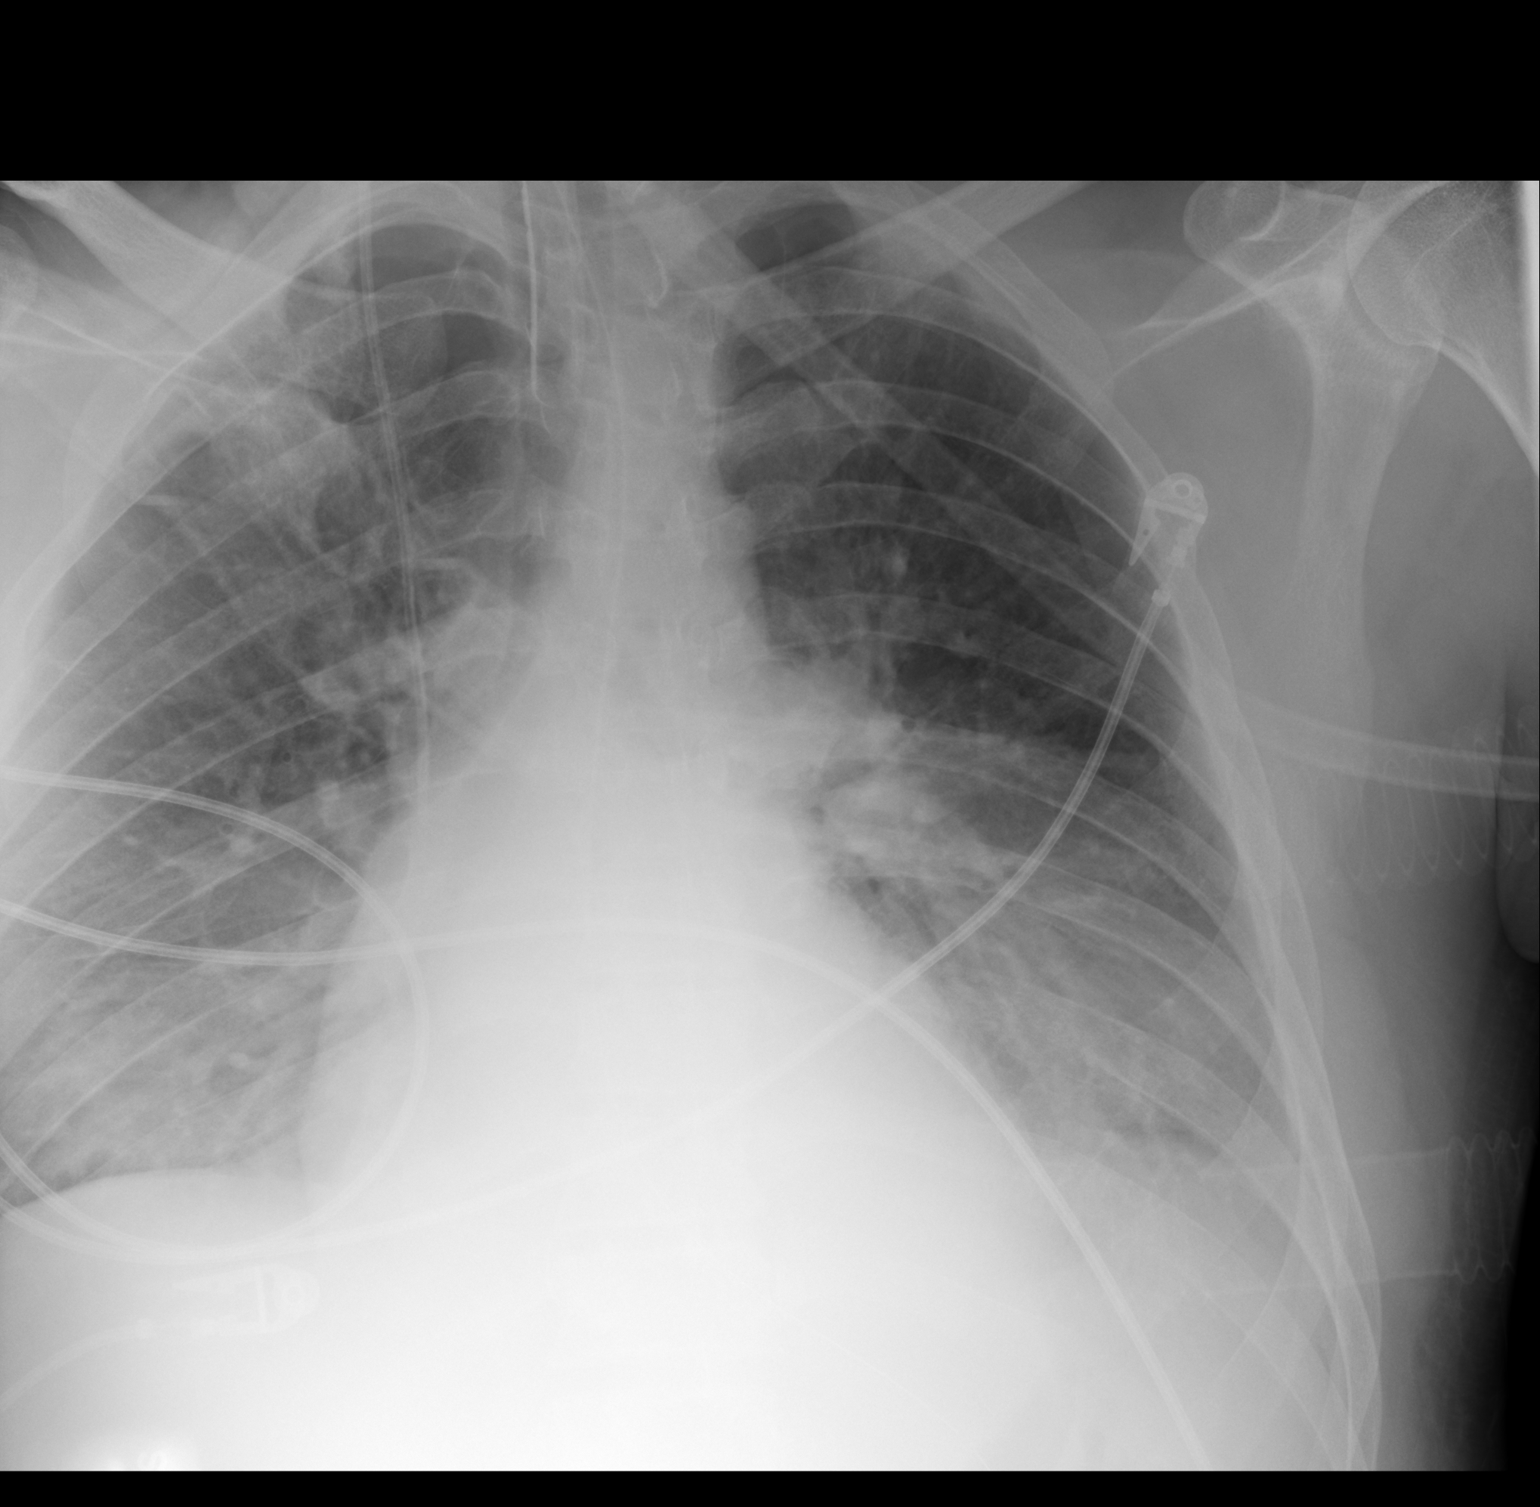
[im 2/2]
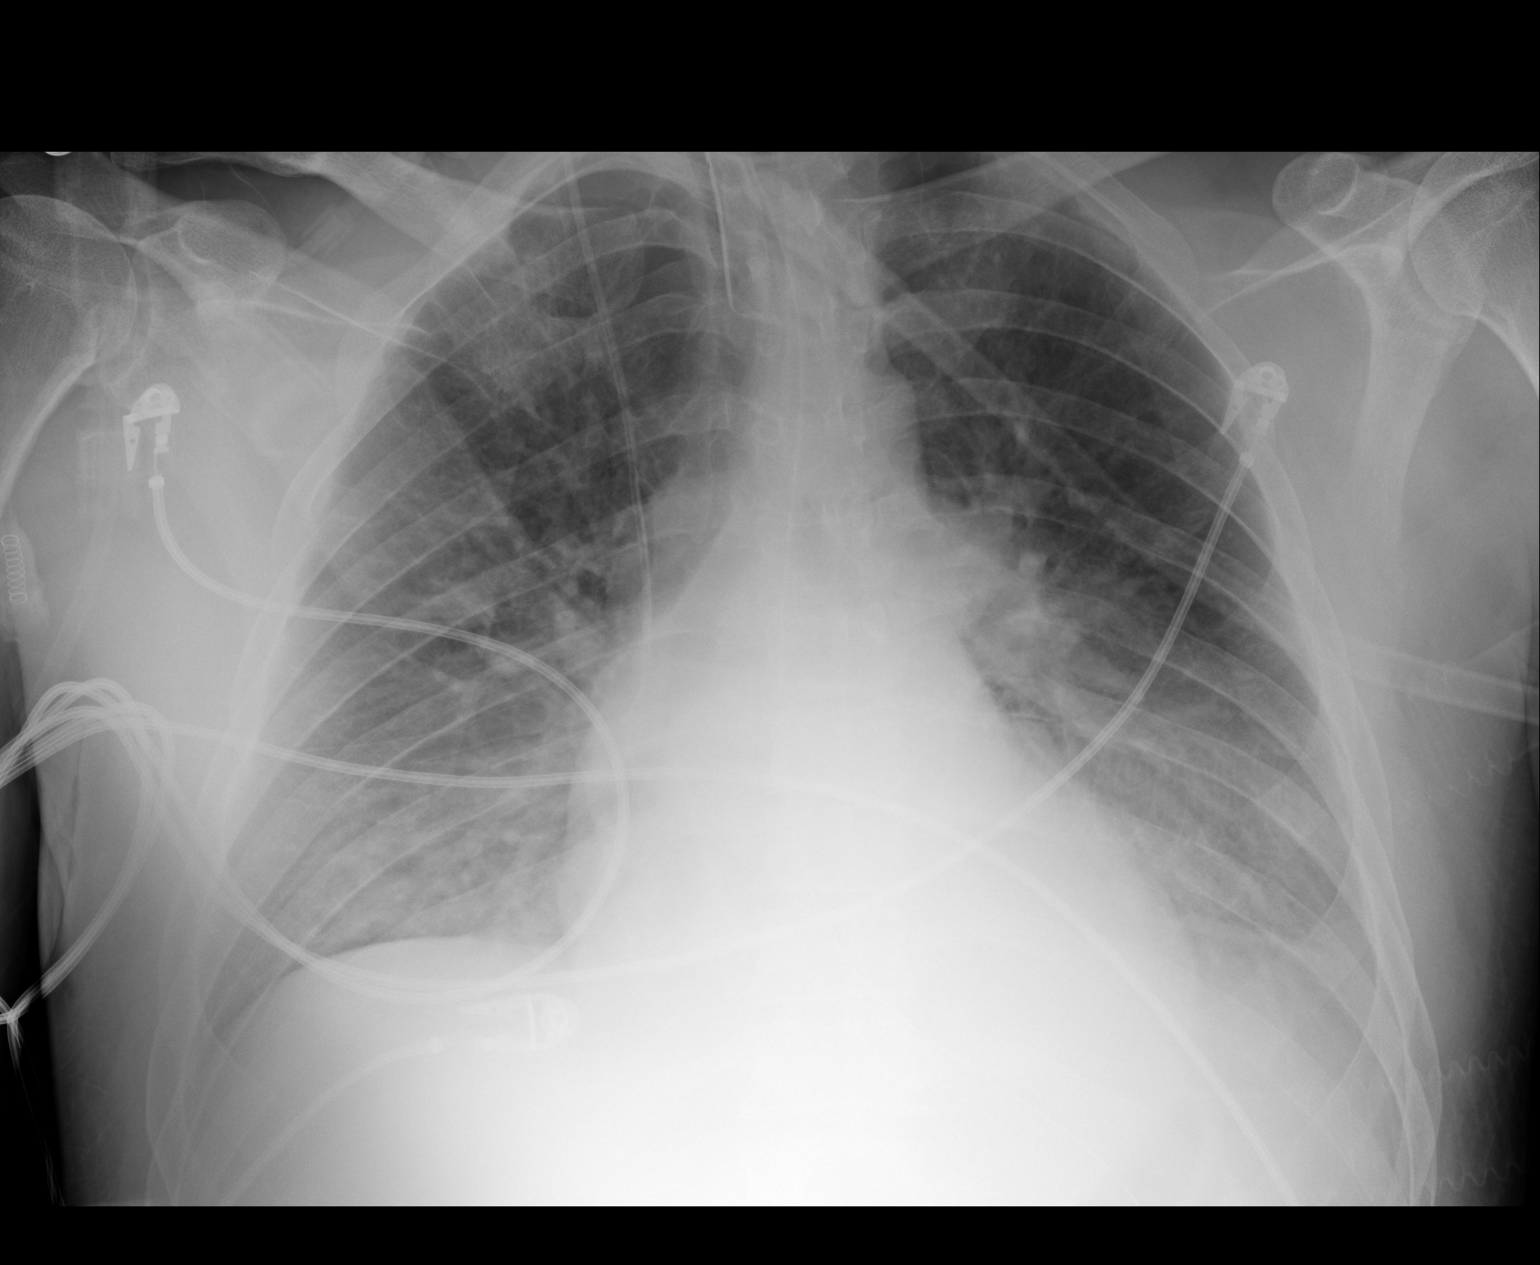

[2 of 2 positions shown; findings below may reference images not displayed]

FINDINGS: Endotracheal tube tip is 5.0 cm above the Carina. Central catheter
tip is in the superior vena cava near the cavoatrial junction.
Nasogastric tube tip and side port are below the diaphragm. No
pneumothorax. There is patchy airspace disease in both lower lobes,
stable. There are probable small effusions is well, stable. There is
no new opacity. Heart is upper normal in size with normal pulmonary
vascularity. No adenopathy.
IMPRESSION: Airspace disease in both lower lobes with consolidation in the
medial left and right lung bases, stable. There may be small
effusions bilaterally. No new opacity. Tube and catheter positions
as described without pneumothorax.

## 2016-01-21 IMAGING — CR DG ABDOMEN 1V
1 series · 2 of 2 positions shown · non-contrast
Comparison: None.

CLINICAL DATA: Possible ileus, ventilated patient.

EXAM:
ABDOMEN - 1 VIEW

[Series 1: supine kub · 0.17mm/px · 2 of 2 slices shown]
[im 1/2]
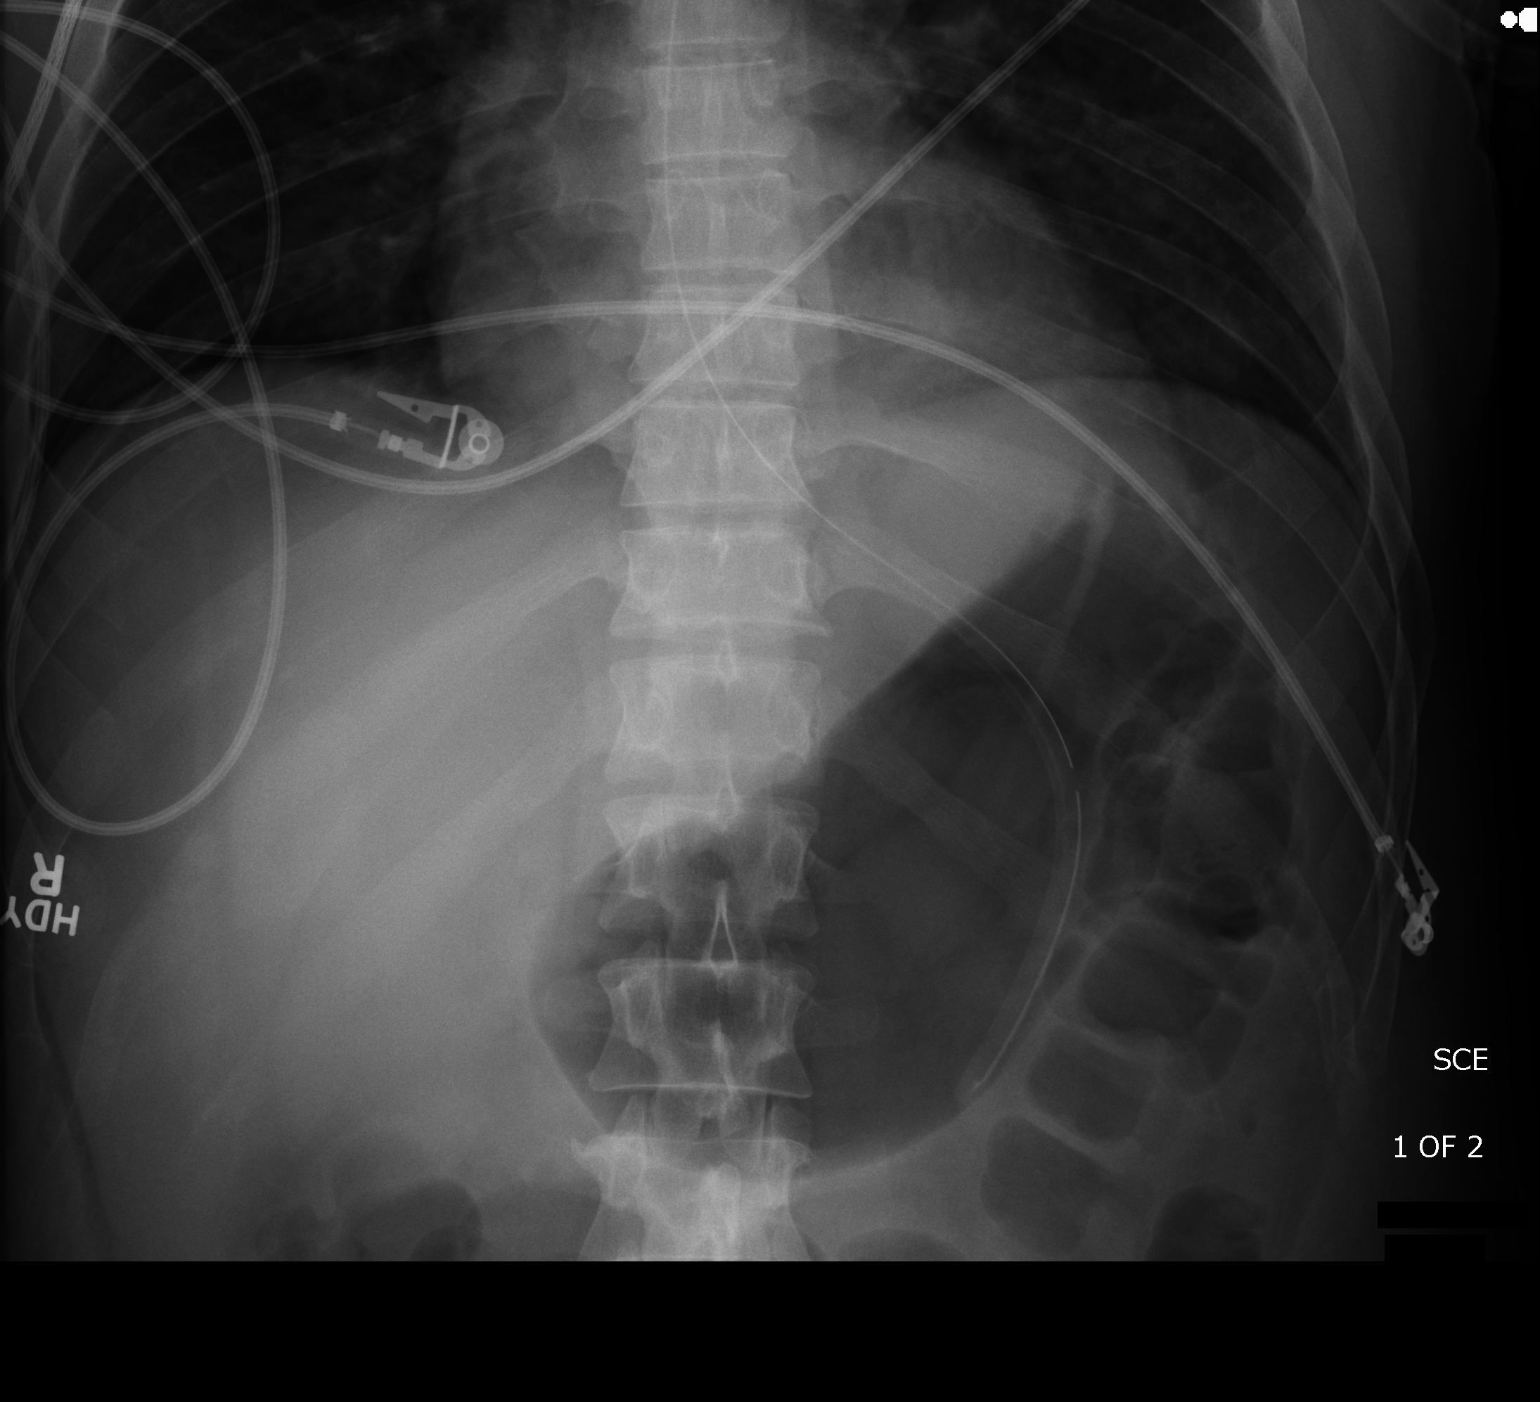
[im 2/2]
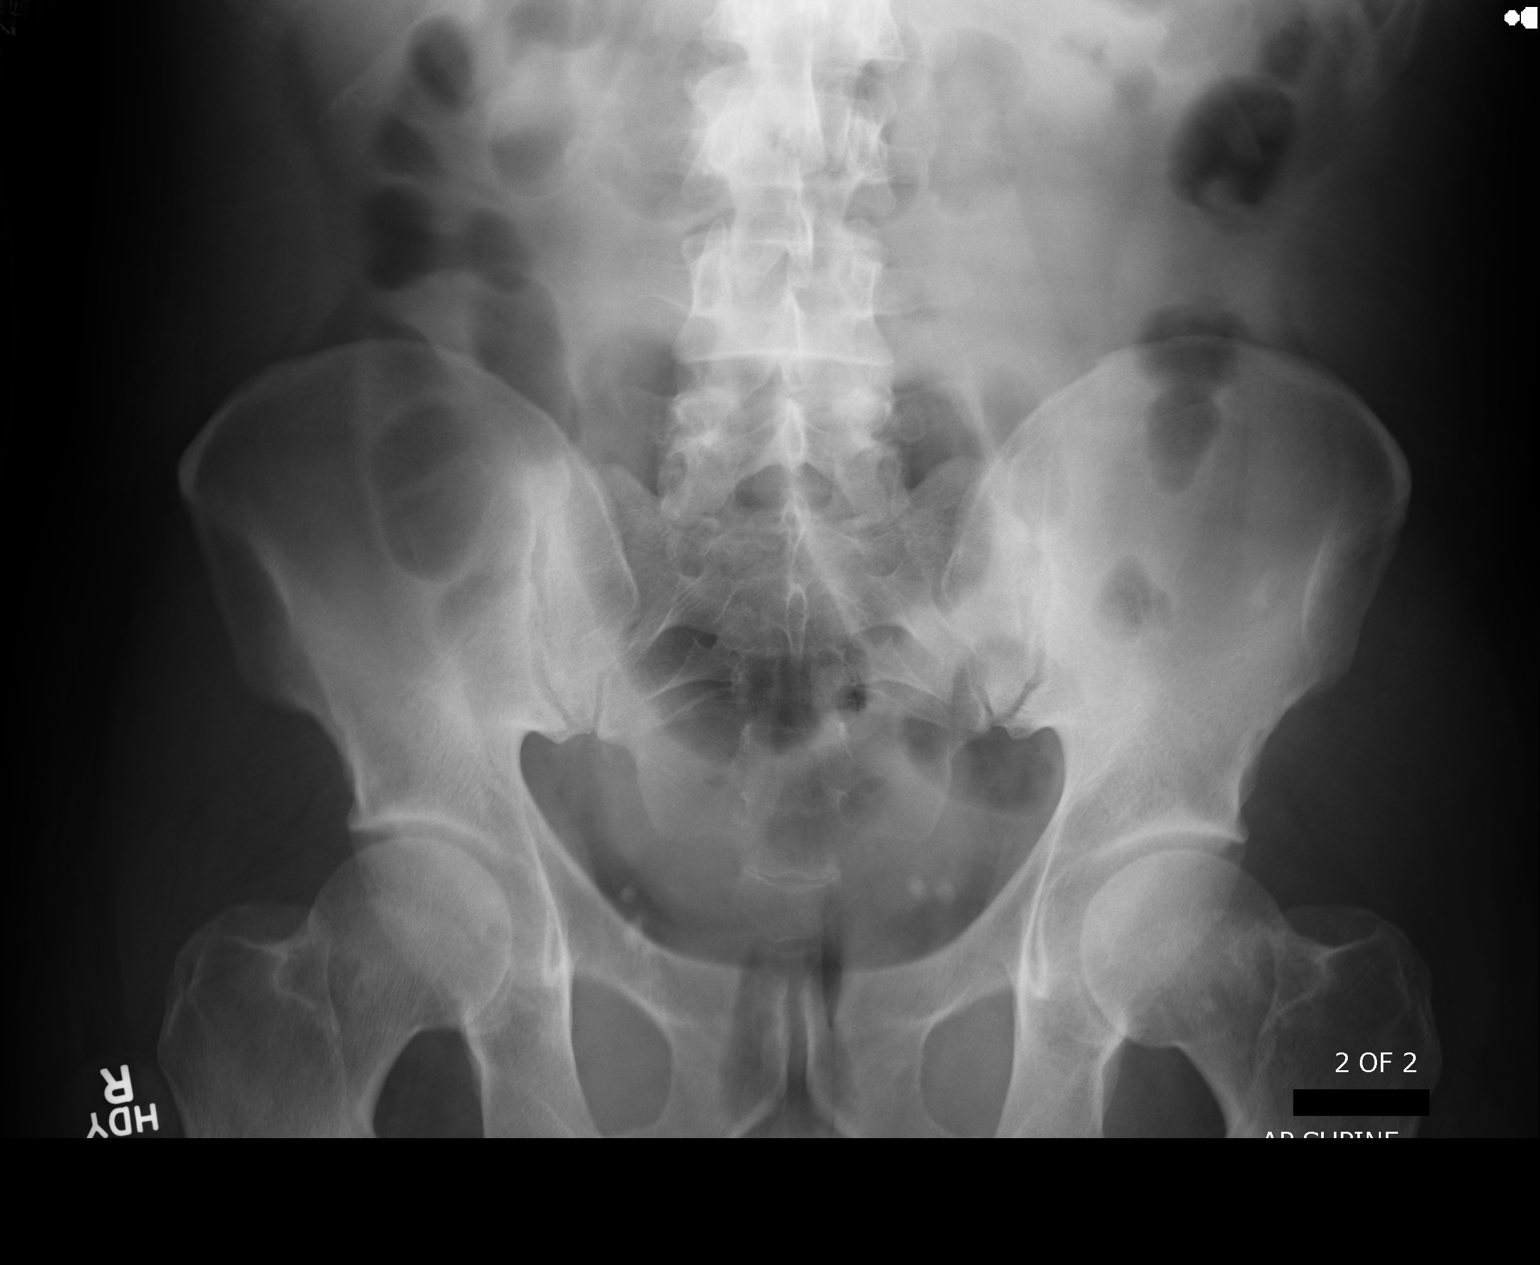

[2 of 2 positions shown; findings below may reference images not displayed]

FINDINGS: The bowel gas pattern is normal. Nasogastric tube tip projects in
proximal to mid stomach. No radio-opaque calculi or other
significant radiographic abnormality are seen. Phleboliths project
in the pelvis. Multiple EKG lines overlie the patient and may
obscure subtle underlying pathology.
IMPRESSION: Nasogastric tube tip projects in proximal to mid stomach,
nonspecific bowel gas pattern.

  By: Rudi Jumper

## 2016-03-14 ENCOUNTER — Emergency Department
Admission: EM | Admit: 2016-03-14 | Discharge: 2016-03-15 | Disposition: A | Discharge status: Home / Self Care | Attending: Emergency Medicine | Admitting: Emergency Medicine

## 2016-03-14 DIAGNOSIS — F4325 Adjustment disorder with mixed disturbance of emotions and conduct: Secondary | ICD-10-CM | POA: Insufficient documentation

## 2016-03-14 DIAGNOSIS — Z5181 Encounter for therapeutic drug level monitoring: Secondary | ICD-10-CM | POA: Insufficient documentation

## 2016-03-14 DIAGNOSIS — R45851 Suicidal ideations: Secondary | ICD-10-CM | POA: Insufficient documentation

## 2016-03-14 DIAGNOSIS — F6089 Other specific personality disorders: Secondary | ICD-10-CM

## 2016-03-14 LAB — ETHANOL

## 2016-03-14 LAB — COMPREHENSIVE METABOLIC PANEL
ALBUMIN: 4.8 g/dL (ref 3.5–5.0)
ALT: 51 U/L (ref 17–63)
ANION GAP: 6 (ref 5–15)
AST: 46 U/L — ABNORMAL HIGH (ref 15–41)
Alkaline Phosphatase: 89 U/L (ref 38–126)
BILIRUBIN TOTAL: 0.8 mg/dL (ref 0.3–1.2)
BUN: 13 mg/dL (ref 6–20)
CO2: 27 mmol/L (ref 22–32)
Calcium: 9.6 mg/dL (ref 8.9–10.3)
Chloride: 105 mmol/L (ref 101–111)
Creatinine, Ser: 1.01 mg/dL (ref 0.61–1.24)
GFR calc Af Amer: >60 mL/min (ref >60)
Glucose, Bld: 105 mg/dL — ABNORMAL HIGH (ref 65–99)
POTASSIUM: 3.7 mmol/L (ref 3.5–5.1)
Sodium: 138 mmol/L (ref 135–145)
TOTAL PROTEIN: 8 g/dL (ref 6.5–8.1)

## 2016-03-14 LAB — ACETAMINOPHEN LEVEL

## 2016-03-14 LAB — CBC
HCT: 47.4 % (ref 40.0–52.0)
Hemoglobin: 16.5 g/dL (ref 13.0–18.0)
MCH: 32.3 pg (ref 26.0–34.0)
MCHC: 34.8 g/dL (ref 32.0–36.0)
MCV: 92.9 fL (ref 80.0–100.0)
PLATELETS: 229 10*3/uL (ref 150–440)
RBC: 5.1 MIL/uL (ref 4.40–5.90)
RDW: 13.5 % (ref 11.5–14.5)
WBC: 13.6 10*3/uL — AB (ref 3.8–10.6)

## 2016-03-14 LAB — URINE DRUG SCREEN, QUALITATIVE (ARMC ONLY)
AMPHETAMINES, UR SCREEN: NOT DETECTED
BENZODIAZEPINE, UR SCRN: NOT DETECTED
Barbiturates, Ur Screen: NOT DETECTED
Cannabinoid 50 Ng, Ur ~~LOC~~: POSITIVE — AB
Cocaine Metabolite,Ur ~~LOC~~: NOT DETECTED
MDMA (Ecstasy)Ur Screen: NOT DETECTED
METHADONE SCREEN, URINE: NOT DETECTED
Opiate, Ur Screen: NOT DETECTED
PHENCYCLIDINE (PCP) UR S: NOT DETECTED
Tricyclic, Ur Screen: NOT DETECTED

## 2016-03-14 LAB — SALICYLATE LEVEL

## 2016-03-14 MED ORDER — ZIPRASIDONE MESYLATE 20 MG IM SOLR
10.0000 mg | Freq: Once | INTRAMUSCULAR | Status: AC
  Administered 2016-03-15: 10 mg via INTRAMUSCULAR

## 2016-03-14 MED ORDER — LORAZEPAM 2 MG PO TABS: 2.0000 mg | ORAL_TABLET | Freq: Once | ORAL | Status: DC

## 2016-03-14 NOTE — ED Notes (Signed)
Pt requesting something for sleep. Will notify md.

## 2016-03-14 NOTE — ED Notes (Signed)
Pt refusing to speak with this rn. Pt states "i'm not talking to you, you can't do anything for me, so stop acting like you care. i'll talk to the doctor, that's it." pt refusing to make eye contact with rn. Pt does request food tray and apple juice which was provided by this rn.

## 2016-03-14 NOTE — ED Notes (Signed)
Pt states "ativan doesn't do anything for me." pt states "i need fucking xanax". Will notify md.

## 2016-03-14 NOTE — ED Notes (Signed)
Pt refusing to speak with TTS at this time.

## 2016-03-14 NOTE — Progress Notes (Signed)
TTS was unable to contact collaterals. A call  Was made to [phone] [facility](Mandy - listed as significant other) - call went to voice mail, due to this being a work Recruitment consultantvoice mail, no message was left.

## 2016-03-14 NOTE — Progress Notes (Signed)
Mr. [name] refused to speak with the TTS. Stating  He will "Show my ass all fucking night.", "I want to see that bitch that has the medication to make me sleep". Patient is being loud and verbally aggressive with hospital staff. .Marland Kitchen

## 2016-03-14 NOTE — ED Notes (Signed)
Per bhu, pt cannot be moved to bhu without tts assessment completed. Pt refusing to speak with tts. Pt yelling at staff "fuck you, i'm gonna show my ass until I get what I want." pt moved to private room 21, due to increased agitation in hallway.
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
## 2016-03-14 NOTE — ED Notes (Signed)
Pt's behavior has improved. Per md will hold geodon at this time, and administer if pt agitated again.

## 2016-03-15 DIAGNOSIS — F4325 Adjustment disorder with mixed disturbance of emotions and conduct: Secondary | ICD-10-CM

## 2016-03-15 DIAGNOSIS — F6089 Other specific personality disorders: Secondary | ICD-10-CM

## 2016-03-15 MED ORDER — NICOTINE 21 MG/24HR TD PT24
MEDICATED_PATCH | TRANSDERMAL | Status: AC
  Administered 2016-03-15: 10:00:00
  Filled 2016-03-15: qty 1

## 2016-03-15 MED ORDER — ZIPRASIDONE MESYLATE 20 MG IM SOLR
10.0000 mg | Freq: Once | INTRAMUSCULAR | Status: AC
  Administered 2016-03-15: 10 mg via INTRAMUSCULAR

## 2016-03-15 MED ORDER — ZIPRASIDONE MESYLATE 20 MG IM SOLR
INTRAMUSCULAR | Status: AC
  Filled 2016-03-15: qty 20

## 2016-03-15 MED ORDER — NICOTINE 21 MG/24HR TD PT24
21.0000 mg | MEDICATED_PATCH | Freq: Once | TRANSDERMAL | Status: DC
  Administered 2016-03-15: 21 mg via TRANSDERMAL
  Filled 2016-03-15: qty 1

## 2016-03-15 NOTE — Consult Note (Signed)
[facility] Face-to-Face Psychiatry Consult   Reason for Consult:  Consult for 45 year old man with a history of behavior problems who was brought in to the hospital after an incident involving text messages to his girlfriend. Referring Physician:  Mariea Clonts Patient Identification: [name] MRN:  [id number] Principal Diagnosis: Adjustment disorder with mixed disturbance of emotions and conduct Diagnosis:   Patient Active Problem List   Diagnosis Date Noted  . Adjustment disorder with mixed disturbance of emotions and conduct [F43.25] 03/15/2016  . 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 male patient admitted with "I made a stupid comment".  HPI:  Patient interviewed. Chart reviewed. Labs and vitals reviewed. Patient known from previous encounters. This is a 45 year old man with a past history of substance abuse impulsivity and behavior problems. He was brought in by Event organiser after an incident in which she had made text messages to his girlfriend that seemed to imply suicidality. When police went to pick him up he was hiding from them. Patient interviewed today admits that he made comments that sounded suicidal on a text message. He said that he was having an argument was angry with his girlfriend. He absolutely denies that he had any intention to hurt or kill himself. Denies that he has any thoughts of hurting her or being aggressive to anyone. He says his mood is currently feeling stable and normal. A couple weeks ago he says he was prescribed fluoxetine by a doctor at the [facility] clinic which made him feel irritable and angry so he stopped it about 5 days ago. Now his mood is back to feeling okay. Patient says he is sleeping adequately. No physical complaints. He denies that he's been drinking alcohol or using any drugs recently. He is not currently getting any kind of outpatient psychiatric  treatment. Denies any psychotic symptoms.  Social history: Patient lives with his girlfriend. He is not currently employed.  Medical history: History of hepatitis C. He also has a history of "seizures" the nature of which I think is still unclear. I've seen him on some of these episodes before when it seemed to definitely be related to alcohol withdrawal. At times also it seems to be more pseudoseizures. In any case he is not on any medicine for them.  Substance abuse history: Extensive history of abuse of alcohol and other drugs. Patient has been hospitalized in the past because of substance abuse. Currently he denies that he's been using alcohol or drugs and is screen is all negative.  Past Psychiatric History: Patient has a past history of substance abuse primarily. History of impulsive behavior. History of mood lability. Recent trial of antidepressant reportedly made him more irritable and angry. Doesn't remember any other medicine ever helping with his mood. Denies having tried to kill himself. Not clear that he's ever had an actual hospitalization.  Risk to Self: Is patient at risk for suicide?: No, but patient needs Medical Clearance (denies at this time - states "I just made a statement") Risk to Others:   Prior Inpatient Therapy:   Prior Outpatient Therapy:    Past Medical History: No past medical history on file. No past surgical history on file. Family History: No family history on file. Family Psychiatric  History: Family history of anxiety and his mother Social History:  History  Alcohol use Not on file     History  Drug use:  Unknown    Social History   Social History  . Marital status: Single    Spouse name: N/A  . Number of children: N/A  . Years of education: N/A   Social History Main Topics  . Smoking status: Not on file  . Smokeless tobacco: Not on file  . Alcohol use Not on file  . Drug use: Unknown  . Sexual activity: Not on file   Other Topics Concern  .  Not on file   Social History Narrative  . No narrative on file   Additional Social History:    Allergies:   Allergies  Allergen Reactions  . Asa [Aspirin] Anaphylaxis  . Haldol [Haloperidol Lactate] Anaphylaxis    Labs:  Results for orders placed or performed during the hospital encounter of 03/14/16 (from the past 48 hour(s))  Comprehensive metabolic panel     Status: Abnormal   Collection Time: 03/14/16  9:22 PM  Result Value Ref Range   Sodium 138 135 - 145 mmol/L   Potassium 3.7 3.5 - 5.1 mmol/L   Chloride 105 101 - 111 mmol/L   CO2 27 22 - 32 mmol/L   Glucose, Bld 105 (H) 65 - 99 mg/dL   BUN 13 6 - 20 mg/dL   Creatinine, Ser 1.01 0.61 - 1.24 mg/dL   Calcium 9.6 8.9 - 10.3 mg/dL   Total Protein 8.0 6.5 - 8.1 g/dL   Albumin 4.8 3.5 - 5.0 g/dL   AST 46 (H) 15 - 41 U/L   ALT 51 17 - 63 U/L   Alkaline Phosphatase 89 38 - 126 U/L   Total Bilirubin 0.8 0.3 - 1.2 mg/dL   GFR calc non Af Amer >60 >60 mL/min   GFR calc Af Amer >60 >60 mL/min    Comment: (NOTE) The eGFR has been calculated using the CKD EPI equation. This calculation has not been validated in all clinical situations. eGFR's persistently <60 mL/min signify possible Chronic Kidney Disease.    Anion gap 6 5 - 15  Ethanol     Status: None   Collection Time: 03/14/16  9:22 PM  Result Value Ref Range   Alcohol, Ethyl (B) <5 <5 mg/dL    Comment:        LOWEST DETECTABLE LIMIT FOR SERUM ALCOHOL IS 5 mg/dL FOR MEDICAL PURPOSES ONLY   Salicylate level     Status: None   Collection Time: 03/14/16  9:22 PM  Result Value Ref Range   Salicylate Lvl <0.6 2.8 - 30.0 mg/dL  Acetaminophen level     Status: Abnormal   Collection Time: 03/14/16  9:22 PM  Result Value Ref Range   Acetaminophen (Tylenol), Serum <10 (L) 10 - 30 ug/mL    Comment:        THERAPEUTIC CONCENTRATIONS VARY SIGNIFICANTLY. A RANGE OF 10-30 ug/mL MAY BE AN EFFECTIVE CONCENTRATION FOR MANY PATIENTS. HOWEVER, SOME ARE BEST TREATED AT  CONCENTRATIONS OUTSIDE THIS RANGE. ACETAMINOPHEN CONCENTRATIONS >150 ug/mL AT 4 HOURS AFTER INGESTION AND >50 ug/mL AT 12 HOURS AFTER INGESTION ARE OFTEN ASSOCIATED WITH TOXIC REACTIONS.   cbc     Status: Abnormal   Collection Time: 03/14/16  9:22 PM  Result Value Ref Range   WBC 13.6 (H) 3.8 - 10.6 K/uL   RBC 5.10 4.40 - 5.90 MIL/uL   Hemoglobin 16.5 13.0 - 18.0 g/dL   HCT 47.4 40.0 - 52.0 %   MCV 92.9 80.0 - 100.0 fL   MCH 32.3 26.0 - 34.0 pg   MCHC  34.8 32.0 - 36.0 g/dL   RDW 13.5 11.5 - 14.5 %   Platelets 229 150 - 440 K/uL  Urine Drug Screen, Qualitative     Status: Abnormal   Collection Time: 03/14/16  9:24 PM  Result Value Ref Range   Tricyclic, Ur Screen NONE DETECTED NONE DETECTED   Amphetamines, Ur Screen NONE DETECTED NONE DETECTED   MDMA (Ecstasy)Ur Screen NONE DETECTED NONE DETECTED   Cocaine Metabolite,Ur [place] NONE DETECTED NONE DETECTED   Opiate, Ur Screen NONE DETECTED NONE DETECTED   Phencyclidine (PCP) Ur S NONE DETECTED NONE DETECTED   Cannabinoid 50 Ng, Ur [place] POSITIVE (A) NONE DETECTED   Barbiturates, Ur Screen NONE DETECTED NONE DETECTED   Benzodiazepine, Ur Scrn NONE DETECTED NONE DETECTED   Methadone Scn, Ur NONE DETECTED NONE DETECTED    Comment: (NOTE) 637  Tricyclics, urine               Cutoff 1000 ng/mL 200  Amphetamines, urine             Cutoff 1000 ng/mL 300  MDMA (Ecstasy), urine           Cutoff 500 ng/mL 400  Cocaine Metabolite, urine       Cutoff 300 ng/mL 500  Opiate, urine                   Cutoff 300 ng/mL 600  Phencyclidine (PCP), urine      Cutoff 25 ng/mL 700  Cannabinoid, urine              Cutoff 50 ng/mL 800  Barbiturates, urine             Cutoff 200 ng/mL 900  Benzodiazepine, urine           Cutoff 200 ng/mL 1000 Methadone, urine                Cutoff 300 ng/mL 1100 1200 The urine drug screen provides only a preliminary, unconfirmed 1300 analytical test result and should not be used for non-medical 1400 purposes. Clinical  consideration and professional judgment should 1500 be applied to any positive drug screen result due to possible 1600 interfering substances. A more specific alternate chemical method 1700 must be used in order to obtain a confirmed analytical result.  1800 Gas chromato graphy / mass spectrometry (GC/MS) is the preferred 1900 confirmatory method.     Current Facility-Administered Medications  Medication Dose Route Frequency Provider Last Rate Last Dose  . LORazepam (ATIVAN) tablet 2 mg  2 mg Oral Once [place], MD      . nicotine (NICODERM CQ - dosed in mg/24 hours) patch 21 mg  21 mg Transdermal Once Paulette Blanch, MD   21 mg at 03/15/16 0125   Current Outpatient Prescriptions  Medication Sig Dispense Refill  . traMADol (ULTRAM) 50 MG tablet Take 1 tablet (50 mg total) by mouth every 6 (six) hours as needed. 24 tablet 0    Musculoskeletal: Strength & Muscle Tone: within normal limits Gait & Station: normal Patient leans: N/A  Psychiatric Specialty Exam: Physical Exam  Nursing note and vitals reviewed. Constitutional: He appears well-developed and well-nourished.  HENT:  Head: Normocephalic and atraumatic.  Eyes: Conjunctivae are normal. Pupils are equal, round, and reactive to light.  Neck: Normal range of motion.  Cardiovascular: Regular rhythm and normal heart sounds.   Respiratory: Effort normal. No respiratory distress.  GI: Soft.  Musculoskeletal: Normal range of motion.  Neurological: He is alert.  Skin: Skin is warm and dry.  Psychiatric: He has a normal mood and affect. His behavior is normal. His speech is rapid and/or pressured. Thought content is not paranoid and not delusional. Cognition and memory are normal. He expresses impulsivity. He expresses no homicidal and no suicidal ideation.    Review of Systems  Constitutional: Negative.   HENT: Negative.   Eyes: Negative.   Respiratory: Negative.   Cardiovascular: Negative.   Gastrointestinal:  Negative.   Musculoskeletal: Negative.   Skin: Negative.   Neurological: Negative.   Psychiatric/Behavioral: Negative for depression, hallucinations, memory loss, substance abuse and suicidal ideas. The patient is nervous/anxious. The patient does not have insomnia.     Blood pressure (!) 109/58, pulse 73, temperature 97.7 F (36.5 C), temperature source Oral, resp. rate 18, height 6' 1"  (1.854 m), weight 86.2 kg (190 lb), SpO2 98 %.Body mass index is 25.07 kg/m.  General Appearance: Fairly Groomed  Eye Contact:  Good  Speech:  Clear and Coherent  Volume:  Increased  Mood:  Euthymic  Affect:  Full Range  Thought Process:  Goal Directed  Orientation:  Full (Time, Place, and Person)  Thought Content:  Logical  Suicidal Thoughts:  No  Homicidal Thoughts:  No  Memory:  Immediate;   Good Recent;   Fair Remote;   Fair  Judgement:  Impaired  Insight:  Shallow  Psychomotor Activity:  Normal  Concentration:  Concentration: Fair  Recall:  [employer] of Knowledge:  Fair  Language:  Fair  Akathisia:  No  Handed:  Right  AIMS (if indicated):     Assets:  Communication Skills Desire for Improvement Housing Physical Health  ADL's:  Intact  Cognition:  WNL  Sleep:        Treatment Plan Summary: Plan 45 year old man with a history of substance abuse impulsivity and behavior very consistent with mixed cluster B personality disorder. Currently the patient is not psychotic. Not depressed. Not suicidal. He reportedly has made aggressive or even threatening statements to his girlfriend but does not show any acute signs of mental illness and to me denies any homicidal or suicidal ideation. No indication for inpatient treatment. Patient strongly encouraged to follow-up with outpatient treatment in the community. He will be given referral information to [place]. Case reviewed with the ER physician. Discontinue IVC.  Disposition: Patient does not meet criteria for psychiatric inpatient  admission. Supportive therapy provided about ongoing stressors.  Alethia Berthold, MD 03/15/2016 1:26 PM

## 2016-03-15 NOTE — ED Notes (Signed)
Pt is currently refusing EKG

## 2016-03-15 NOTE — ED Notes (Signed)
MD aware of pt's reported seizure disorder and recent seizure.

## 2016-03-15 NOTE — ED Provider Notes (Signed)
[facility] Emergency Department Provider Note  ____________________________________________  Time seen: Approximately 1:20 AM  I have reviewed the triage vital signs and the nursing notes.   HISTORY  Chief Complaint Psychiatric Evaluation   HPI [name] is a 45 y.o. male with h/o HepC who presents IVC by police for suicide threats. According to the police officer, patient sent a text to his girlfriend saying that he was going to kill himself by hanging. Girlfriend called 911 and police went to patient's house. Patient was in the woods and tried to escape from police. Per officer patient had severe mood swings en route initially cursing and demanding, to crying an apologetic. Officer also reports that patient has had prior history of suicidal ideation and possible suicidal intent in the past in the setting of break ups. Upon arrival to the ED patient is calm and tells me he is here because he sent a stupid text in order to get attention from his girlfriend who was trying to break up with him. Patient denies prior h/o depression. Patient endorses having very realistic dreams while on a coma 2 years ago. He reports that he had a dream that he exploded a helicopter with people inside. He feels that the dream was so realistic that it still bothers him to this day to be able to hear the people in the helicopter crying for help. 2 weeks ago he went [facility] and talked to a provider about these dreams. He was started on prozac however he is not taking it as he gets very sedated. He endorses smoking cigarettes and MJ. He denies alcohol or any other drugs. He denies SI, HI, hallucinations, delusions.  No past medical history on file.  There are no active problems to display for this patient.   No past surgical history on file.  Prior to Admission medications   Medication Sig Start Date End Date Taking? Authorizing Provider  traMADol (ULTRAM) 50 MG  tablet Take 1 tablet (50 mg total) by mouth every 6 (six) hours as needed. 09/17/15 09/16/16  Leona CarryLinda M Taylor, MD    Allergies Asa [aspirin] and Haldol [haloperidol lactate]  No family history on file.  Social History Social History  Substance Use Topics  . Smoking status: Not on file  . Smokeless tobacco: Not on file  . Alcohol use Not on file    Review of Systems Constitutional: Negative for fever. Eyes: Negative for visual changes. ENT: Negative for sore throat. Cardiovascular: Negative for chest pain. Respiratory: Negative for shortness of breath. Gastrointestinal: Negative for abdominal pain, vomiting or diarrhea. Genitourinary: Negative for dysuria. Musculoskeletal: Negative for back pain. Skin: Negative for rash. Neurological: Negative for headaches, weakness or numbness. Psych: Denies SI, HI  ____________________________________________   PHYSICAL EXAM:  VITAL SIGNS: ED Triage Vitals  Enc Vitals Group     BP 03/14/16 2123 128/72     Pulse Rate 03/14/16 2123 88     Resp 03/14/16 2123 20     Temp 03/14/16 2123 98.7 F (37.1 C)     Temp Source 03/14/16 2123 Oral     SpO2 03/14/16 2123 97 %     Weight 03/14/16 2106 190 lb (86.2 kg)     Height 03/14/16 2106 6\' 1"  (1.854 m)     Head Circumference --      Peak Flow --      Pain Score --      Pain Loc --  Pain Edu? --      Excl. in GC? --     Constitutional: Alert and oriented. Well appearing and in no apparent distress. HEENT:      Head: Normocephalic and atraumatic.         Eyes: Conjunctivae are normal. Sclera is non-icteric. EOMI. PERRL      Mouth/Throat: Mucous membranes are moist.       Neck: Supple with no signs of meningismus. Cardiovascular: Regular rate and rhythm. No murmurs, gallops, or rubs. 2+ symmetrical distal pulses are present in all extremities. No JVD. Respiratory: Normal respiratory effort. Lungs are clear to auscultation bilaterally. No wheezes, crackles, or rhonchi.    Gastrointestinal: Soft, non tender, and non distended with positive bowel sounds. No rebound or guarding. Genitourinary: No CVA tenderness. Musculoskeletal: Nontender with normal range of motion in all extremities. No edema, cyanosis, or erythema of extremities. Neurologic: Normal speech and language. Face is symmetric. Moving all extremities. No gross focal neurologic deficits are appreciated. Skin: Skin is warm, dry and intact. No rash noted. Psychiatric: Mood and affect are normal. Speech and behavior are normal. Denies SI, HI  ____________________________________________   LABS (all labs ordered are listed, but only abnormal results are displayed)  Labs Reviewed  COMPREHENSIVE METABOLIC PANEL - Abnormal; Notable for the following:       Result Value   Glucose, Bld 105 (*)    AST 46 (*)    All other components within normal limits  ACETAMINOPHEN LEVEL - Abnormal; Notable for the following:    Acetaminophen (Tylenol), Serum <10 (*)    All other components within normal limits  CBC - Abnormal; Notable for the following:    WBC 13.6 (*)    All other components within normal limits  URINE DRUG SCREEN, QUALITATIVE ([facility] ONLY) - Abnormal; Notable for the following:    Cannabinoid 50 Ng, Ur [place] POSITIVE (*)    All other components within normal limits  ETHANOL  SALICYLATE LEVEL   ____________________________________________  EKG  none ____________________________________________  RADIOLOGY  none  ____________________________________________   PROCEDURES  Procedure(s) performed: None Procedures Critical Care performed:  None ____________________________________________   INITIAL IMPRESSION / ASSESSMENT AND PLAN / ED COURSE  45 y.o. male with h/o HepC who presents IVC by police for suicide threats after texting his girlfriend that he was going to hang himself. Patient is denying SI at this time but he does have prior h/o making similar threats and possible acting on  them in the past. Therefore will not lift IVC at this time. WIll get labs for medical clearance and consult psych and TTS.  Clinical Course  Comment By Time  Patient did not receive Geodon secondary to being more cooperative. At this time he is medically cleared and transferred to the [facility] pending psychiatry disposition. Irean Hong, MD 09/25 [phone]    Pertinent labs & imaging results that were available during my care of the patient were reviewed by me and considered in my medical decision making (see chart for details).    ____________________________________________   FINAL CLINICAL IMPRESSION(S) / ED DIAGNOSES  Final diagnoses:  Suicidal ideation      NEW MEDICATIONS STARTED DURING THIS VISIT:  New Prescriptions   No medications on file     Note:  This document was prepared using Dragon voice recognition software and may include unintentional dictation errors.    Nita Sickle, MD 03/15/16 0130

## 2016-03-15 NOTE — ED Notes (Signed)
Report to margaret, rn.  

## 2016-03-15 NOTE — ED Notes (Signed)
Patient entered ED-[facility]; Patent became demanding; loud; yelling; slamming the door; cursing; refusing to cooperate; received medication; with several security and [employer] assisting.

## 2016-03-15 NOTE — ED Notes (Signed)
Pt is extremely loud and verbally aggressive. Threatening to "go off" if he does not get to call girl friend. Call previously received from GF who said that he has been threatening her over the phone. She reports that she is in the process of obtaining a restraining order against the patient. Advised SW of this so that gf can be notified fi pt is discharged.

## 2016-03-15 NOTE — ED Notes (Signed)
Two bags of belongings given to pt at discharge. AVS given and reviewed with patient. Pt refused bus pass.

## 2016-03-15 NOTE — Discharge Instructions (Signed)
Return to the emergency department for thoughts of hurting yourself or anyone else, hallucinations, or any other symptoms concerning to you.

## 2016-03-15 NOTE — ED Notes (Signed)
Report was received from April B., RN;  Earlier in the shift; Pt. Was loud; cursing; demanding refusing to cooperate; refusing TTS assessment; medication was given; Pt. Had text his girlfriend stating that; he was going to harm himself; later stated, "I made a stupid mistake."  Continue to monitor with 15 min. Monitoring.

## 2016-03-15 NOTE — ED Notes (Signed)
Left message with pt's girlfriend that we returned her call as requested. Left unit phone number so that she can call back if she wants.

## 2016-03-15 NOTE — ED Notes (Signed)
Left message with girlfriend that we are returning her call, at her request. Left phone number of this unit so that she can call back.

## 2016-03-15 NOTE — ED Provider Notes (Signed)
-----------------------------------------   7:43 AM on 03/15/2016 -----------------------------------------   Blood pressure (!) 109/58, pulse 73, temperature 97.7 F (36.5 C), temperature source Oral, resp. rate 18, height 6\' 1"  (1.854 m), weight 190 lb (86.2 kg), SpO2 98 %.  Patient required IM calming agent [facility] No further events overnight.  Calm and cooperative at this time.  Disposition is pending Psychiatry/Behavioral Medicine team recommendations.     Irean HongJade J Sung, MD 03/15/16 [phone]

## 2016-03-15 NOTE — ED Notes (Signed)
Soda requested and given.

## 2016-03-15 NOTE — ED Notes (Signed)
Pt is alternately angry and tearful. He spoke with his girlfriend. He believes that they are breaking up. Calm at this time.

## 2016-03-15 NOTE — ED Notes (Signed)

## 2016-03-15 NOTE — ED Notes (Signed)
Pt is awake now. He is watching tv in his room. Asking when he will see psychiatrist.

## 2016-03-15 NOTE — ED Notes (Addendum)
Pt agitated in [facility], yelling, swearing. Police and rn with bhu rn in to speak with pt regarding geodon im. Pt states "come on you fat fucking bitch, let's go."

## 2016-03-15 NOTE — ED Provider Notes (Signed)
The patient has called and is threatening his girlfriend. At this time, we will suspend all further phone calls. The patient is becoming verbally and emotionally threatening to the staff, and we will plan intramuscular sedation with Geodon. The patient has anaphylaxis to Haldol. Once he is sedated, and EKG will be obtained to evaluate his QTC.   Rockne MenghiniAnne-Caroline Gale Klar, MD 03/15/16 [phone]
